# Patient Record
Sex: Female | Born: 1935 | Race: Black or African American | Hispanic: No | Smoking: Never smoker
Health system: Southern US, Community
[De-identification: ages and names within clinical notes are randomized; demographics above are authoritative.]

## PROBLEM LIST (undated history)

## (undated) DIAGNOSIS — M1990 Unspecified osteoarthritis, unspecified site: Secondary | ICD-10-CM

## (undated) HISTORY — DX: Unspecified osteoarthritis, unspecified site: M19.90

---

## 2006-12-17 ENCOUNTER — Emergency Department (HOSPITAL_COMMUNITY): Admission: EM | Admit: 2006-12-17 | Discharge: 2006-12-17 | Payer: Self-pay | Admitting: Emergency Medicine

## 2010-11-28 ENCOUNTER — Encounter: Payer: Self-pay | Admitting: Family Medicine

## 2015-04-05 ENCOUNTER — Emergency Department (HOSPITAL_COMMUNITY): Admission: EM | Admit: 2015-04-05 | Discharge: 2015-04-05 | Discharge status: Absent without leave | Payer: Self-pay

## 2015-04-05 ENCOUNTER — Ambulatory Visit (INDEPENDENT_AMBULATORY_CARE_PROVIDER_SITE_OTHER): Payer: Medicare Other | Admitting: Physician Assistant

## 2015-04-05 VITALS — BP 100/62 | HR 90 | Temp 97.9°F | Resp 17 | Ht <= 58 in | Wt 198.0 lb

## 2015-04-05 DIAGNOSIS — L723 Sebaceous cyst: Secondary | ICD-10-CM

## 2015-04-05 DIAGNOSIS — L089 Local infection of the skin and subcutaneous tissue, unspecified: Secondary | ICD-10-CM

## 2015-04-05 MED ORDER — DOXYCYCLINE HYCLATE 100 MG PO CAPS: 100.0000 mg | ORAL_CAPSULE | Freq: Two times a day (BID) | ORAL | Status: DC

## 2015-04-05 NOTE — Patient Instructions (Signed)
Please keep the dressing in place until tomorrow. You can change the dressing tomorrow but be sure to keep the packing in place. She can shower as normal after 24 hours.  Please take the antibiotic twice daily for 10 days.  Please come back to see us on Tuesday. Sooner if any signs of infection.

## 2015-04-05 NOTE — Progress Notes (Signed)
   Subjective:    Patient ID: Suzanne Case, female    DOB: 25-Jul-1927, 81 y.o.   MRN: [id number]  Chief Complaint  Patient presents with  . Mass    x1 week rt leg and lt arm     Medications, allergies, past medical history, surgical history, family history, social history and problem list reviewed and updated.  HPI  [age] yof new to practice presents with spot on left arm and right leg.  Recently stayed in different bed while visiting [place] couple weeks ago. Approx one week ago noticed lump on left forearm and smaller area on right lower leg. Her son is a IT sales professionalfirefighter and tried popping the mass on left arm while at home.   Today she presents as they are ongoing. Denies fevers, chills. Has not taken anything for them.   Review of Systems See HPI.     Objective:   Physical Exam  Constitutional: She is oriented to person, place, and time. She appears well-developed and well-nourished.  Non-toxic appearance. She does not have a sickly appearance. She does not appear ill. No distress.  BP 100/62 mmHg  Pulse 90  Temp(Src) 97.9 F (36.6 C) (Oral)  Resp 17  Ht 4\' 10"  (1.473 m)  Wt 198 lb (89.812 kg)  BMI 41.39 kg/m2  SpO2 92%   Neurological: She is alert and oriented to person, place, and time.  Skin:  Approx 1cm area erythema and induration left forearm, center of area is very fluctuant with pus expressing upon presentation.   Small 0.5 cm area erythema with crusted blood in center right mid anterior shin. Minimal induration. No fluctuance.       Assessment & Plan:   [age] yof new to practice presents with spot on left arm and right leg.  Infected sebaceous cyst - Plan: doxycycline (VIBRAMYCIN) 100 MG capsule --Attempted I&D of presumed abscess on left forearm, got both purulence and keratin material from likely infected sebaceous cyst, packing placed --cyst sac broken apart but removed as best as possible --doxy 10 days bid as has slight erythema on right shin and her son poked  into left forearm cyst at home approx one week ago --rtc 48 hrs for wound check  Donnajean Lopesodd M. Mystery Schrupp, PA-C Physician Assistant-Certified [facility]  04/05/2015 9:42 PM

## 2015-04-05 NOTE — Progress Notes (Signed)
Verbal Consent Obtained. Local anesthesia with 2 cc of 1% lidocaine with epi.  11 blade used to incise the lesion centrally.  Sebaceous material expressed along with minimal purulence expressed. Irrigated wound with 5 cc of saline and packed with 1/4 inch plain packing.  Cleansed and dressed.

## 2015-04-07 ENCOUNTER — Ambulatory Visit (INDEPENDENT_AMBULATORY_CARE_PROVIDER_SITE_OTHER): Payer: Medicare Other | Admitting: Physician Assistant

## 2015-04-07 VITALS — BP 120/70 | HR 83 | Temp 98.3°F | Resp 16 | Ht <= 58 in | Wt 194.0 lb

## 2015-04-07 DIAGNOSIS — L723 Sebaceous cyst: Secondary | ICD-10-CM

## 2015-04-07 DIAGNOSIS — L089 Local infection of the skin and subcutaneous tissue, unspecified: Secondary | ICD-10-CM

## 2015-04-07 NOTE — Progress Notes (Signed)
   Subjective:    Patient ID: Suzanne Case, female    DOB: [birth year], 81 y.o.   MRN: [id number]  HPI Patient presents for wound care status post I&D of abscess on right arm 2 days ago. Has been well in the interim. Has tolerated doxycycline well and has changed dressing regularly. Denies fever, drainage, or erythema. NKDA.   Review of Systems  Constitutional: Negative.  Negative for fever.  Skin: Positive for wound. Negative for color change.       Objective:   Physical Exam  Constitutional: She is oriented to person, place, and time. She appears well-developed and well-nourished. No distress.  Blood pressure 120/70, pulse 83, temperature 98.3 F (36.8 C), temperature source Oral, resp. rate 16, height 4\' 10"  (1.473 m), weight 194 lb (87.998 kg), SpO2 94 %.   HENT:  Head: Normocephalic and atraumatic.  Right Ear: External ear normal.  Left Ear: External ear normal.  Eyes: Conjunctivae are normal. Right eye exhibits no discharge. Left eye exhibits no discharge. No scleral icterus.  Pulmonary/Chest: Effort normal.  Neurological: She is alert and oriented to person, place, and time.  Skin: Skin is warm and dry. No rash noted. She is not diaphoretic. No erythema. No pallor.  Dressing and packing intact. Upon removal small amount of purulence presents. Necrotic along wound border and lateral aspect of wound.   Psychiatric: She has a normal mood and affect. Her behavior is normal. Judgment and thought content normal.   Procedure Consent obtained. Irrigated with 5 cc 1% lido. Wound explored and some necrotic tissue removed. 1/4 plain packing placed. Clean dressing placed.     Assessment & Plan:  1. Infected sebaceous cyst Continue antibiotic. Heating pad/warm compress on arm 3-4x daily. Patient did not tolerate necrotic tissue being removed well. Repacked and is to RTC 04/09/15 for wound care.    Janan Ridgeishira Kiowa Hollar PA-C  [facility] 04/07/2015 2:41 PM

## 2015-04-10 ENCOUNTER — Ambulatory Visit (INDEPENDENT_AMBULATORY_CARE_PROVIDER_SITE_OTHER): Payer: Medicare Other | Admitting: Physician Assistant

## 2015-04-10 VITALS — BP 136/82 | HR 84 | Temp 97.8°F | Resp 14 | Ht <= 58 in | Wt 194.0 lb

## 2015-04-10 DIAGNOSIS — Z4801 Encounter for change or removal of surgical wound dressing: Secondary | ICD-10-CM

## 2015-04-10 DIAGNOSIS — Z4889 Encounter for other specified surgical aftercare: Secondary | ICD-10-CM

## 2015-04-10 NOTE — Progress Notes (Signed)
[facility] [address], [place] [address] [phone]  Date:  04/10/2015   Name:  Suzanne LocksJessie Case   DOB:  [birth year]   MRN:  [id number]  PCP:  No primary care provider on file.    History of Present Illness:  Suzanne Case is an 81 y.o. female patient who [facility] up of an abscess s/p drainage.  This is day 5 following the incision and drainage from left forearm.  She reports that she is tolerating abx well and compliant on doxycycline.  She denies all increased redness, swelling, pain, fever, nausea, numbness, or tingling to the extremity.  She has been having the dressing changed by firefighter son, who reports that the wound is healing nicely.    There are no active problems to display for this patient.   Past Medical History  Diagnosis Date  . Arthritis     Past Surgical History  Procedure Laterality Date  . Cesarean section      History  Substance Use Topics  . Smoking status: Former Games developermoker  . Smokeless tobacco: Not on file  . Alcohol Use: No    Family History  Problem Relation Age of Onset  . Diabetes Mother   . Heart disease Mother   . Hypertension Mother   . Diabetes Father   . Heart disease Father   . Hypertension Father   . Diabetes Sister   . Heart disease Sister   . Hypertension Sister     No Known Allergies  Medication list has been reviewed and updated.  Current Outpatient Prescriptions on File Prior to Visit  Medication Sig Dispense Refill  . doxycycline (VIBRAMYCIN) 100 MG capsule Take 1 capsule (100 mg total) by mouth 2 (two) times daily. 20 capsule 0   No current facility-administered medications on file prior to visit.    ROS ROS otherwise unremarkable unless listed above.    Physical Examination: BP 136/82 mmHg  Pulse 84  Temp(Src) 97.8 F (36.6 C) (Oral)  Resp 14  Ht 4\' 10"  (1.473 m)  Wt 194 lb (87.998 kg)  BMI 40.56 kg/m2  SpO2 92% Ideal Body Weight: Weight in (lb) to have  BMI = 25: 119.4  Physical Exam  Constitutional: She is oriented to person, place, and time. She appears well-developed and well-nourished.  HENT:  Head: Normocephalic and atraumatic.  Eyes: Pupils are equal, round, and reactive to light.  Pulmonary/Chest: Effort normal. No respiratory distress.  Musculoskeletal: She exhibits no edema.  Neurological: She is alert and oriented to person, place, and time.  Skin: Skin is warm and dry.  Left forearm: Wound site with 1cm wide wound with purulent discharge along ejected packing and superficial layer of skin.  This was irrigated away and vigorously cleansed to reveal beefy red granulated tissue.  Depth of wound .5cm.  Tenderness is minimal with searching and cleansing the wound.  Surrounding skin without tenderness or swelling.    Psychiatric: She has a normal mood and affect. Her behavior is normal.     Assessment and Plan: 81 year old female is here today for followup of wound care on day 5 status post incision and drainage of a left forearm abscess.  This is healing well.  There is minimal depth at this time.  I have advised that she keep the wound clean with soap and water twice per day with a non-deodorant soap.  I have advised to keep bandaging on until tissue has formed within the  wound site.  She is to return for alarming symptoms.  Advised to continue doxycycline to completion.  Patient recommended and given card to expedite a return in 3 days.  Mother and daughter state that they may not be able to return that day or the day after.  I have advised returning for alarming symptoms.  This is cared for by firefighter son, who stays with the patient.  Advised daughter to relay recommendations to son of wound care, and distressing signs for rtc.   Encounter for postoperative wound care  Trena Platt, PA-C [facility] 04/10/2015 2:41 PM

## 2015-04-10 NOTE — Patient Instructions (Signed)
Keep the wound clean with soap and water.  Use a regular unscented soap, and insure that it is NOT a deodorant soap.  Suggestion is dial or an antibacterial soap.  Wash the wound twice per day, and cover with bandaging.  If there is more purulent drainage, swelling, redness, fever, or increased pain, you need to return immediately.  Please continue with the doxycycline to completion.   Incision and Drainage Care After Refer to this sheet in the next few weeks. These instructions provide you with information on caring for yourself after your procedure. Your caregiver may also give you more specific instructions. Your treatment has been planned according to current medical practices, but problems sometimes occur. Call your caregiver if you have any problems or questions after your procedure. HOME CARE INSTRUCTIONS   If antibiotic medicine is given, take it as directed. Finish it even if you start to feel better.  Only take over-the-counter or prescription medicines for pain, discomfort, or fever as directed by your caregiver.  Keep all follow-up appointments as directed by your caregiver.  Change any bandages (dressings) as directed by your caregiver. Replace old dressings with clean dressings.  Wash your hands before and after caring for your wound. You will receive specific instructions for cleansing and caring for your wound.  SEEK MEDICAL CARE IF:   You have increased pain, swelling, or redness around the wound.  You have increased drainage, smell, or bleeding from the wound.  You have muscle aches, chills, or you feel generally sick.  You have a fever. MAKE SURE YOU:   Understand these instructions.  Will watch your condition.  Will get help right away if you are not doing well or get worse. Document Released: 01/16/2012 Document Reviewed: 01/16/2012 [facility] Patient Information 2015 [employer] [place]. This information is not intended to replace advice given to you by your health  care provider. Make sure you discuss any questions you have with your health care provider.

## 2015-06-22 ENCOUNTER — Telehealth: Payer: Self-pay

## 2015-06-22 NOTE — Telephone Encounter (Signed)
Pt's daughter called requesting wound cx results. I do not see where we did a wound cx from visit in May. Pt's abscess has come back. They have an appt with her regular physician to have arm looked at.

## 2015-10-23 DIAGNOSIS — H40013 Open angle with borderline findings, low risk, bilateral: Secondary | ICD-10-CM | POA: Diagnosis not present

## 2015-12-08 DIAGNOSIS — Z23 Encounter for immunization: Secondary | ICD-10-CM | POA: Diagnosis not present

## 2015-12-08 DIAGNOSIS — E7800 Pure hypercholesterolemia, unspecified: Secondary | ICD-10-CM | POA: Diagnosis not present

## 2015-12-08 DIAGNOSIS — M2550 Pain in unspecified joint: Secondary | ICD-10-CM | POA: Diagnosis not present

## 2015-12-08 DIAGNOSIS — Z0000 Encounter for general adult medical examination without abnormal findings: Secondary | ICD-10-CM | POA: Diagnosis not present

## 2015-12-08 DIAGNOSIS — M25512 Pain in left shoulder: Secondary | ICD-10-CM | POA: Diagnosis not present

## 2015-12-08 DIAGNOSIS — R7309 Other abnormal glucose: Secondary | ICD-10-CM | POA: Diagnosis not present

## 2015-12-08 DIAGNOSIS — E559 Vitamin D deficiency, unspecified: Secondary | ICD-10-CM | POA: Diagnosis not present

## 2015-12-09 DIAGNOSIS — N390 Urinary tract infection, site not specified: Secondary | ICD-10-CM | POA: Diagnosis not present

## 2016-01-06 DIAGNOSIS — M1711 Unilateral primary osteoarthritis, right knee: Secondary | ICD-10-CM | POA: Diagnosis not present

## 2016-01-06 DIAGNOSIS — M1712 Unilateral primary osteoarthritis, left knee: Secondary | ICD-10-CM | POA: Diagnosis not present

## 2016-05-23 DIAGNOSIS — R509 Fever, unspecified: Secondary | ICD-10-CM | POA: Diagnosis not present

## 2016-05-24 ENCOUNTER — Observation Stay (HOSPITAL_COMMUNITY): Payer: Medicare Other

## 2016-05-24 ENCOUNTER — Observation Stay (HOSPITAL_COMMUNITY)
Admission: EM | Admit: 2016-05-24 | Discharge: 2016-05-27 | Disposition: A | Discharge status: Home / Self Care | Payer: Medicare Other | Attending: Internal Medicine | Admitting: Internal Medicine

## 2016-05-24 ENCOUNTER — Emergency Department (HOSPITAL_COMMUNITY)
Admission: EM | Admit: 2016-05-24 | Discharge: 2016-05-24 | Disposition: A | Discharge status: Home / Self Care | Payer: Self-pay | Attending: Emergency Medicine | Admitting: Emergency Medicine

## 2016-05-24 ENCOUNTER — Emergency Department (HOSPITAL_COMMUNITY): Payer: Medicare Other

## 2016-05-24 ENCOUNTER — Encounter (HOSPITAL_COMMUNITY): Payer: Self-pay | Admitting: Emergency Medicine

## 2016-05-24 DIAGNOSIS — F0390 Unspecified dementia without behavioral disturbance: Secondary | ICD-10-CM | POA: Insufficient documentation

## 2016-05-24 DIAGNOSIS — R0602 Shortness of breath: Secondary | ICD-10-CM | POA: Diagnosis not present

## 2016-05-24 DIAGNOSIS — G9340 Encephalopathy, unspecified: Secondary | ICD-10-CM | POA: Insufficient documentation

## 2016-05-24 DIAGNOSIS — Z5321 Procedure and treatment not carried out due to patient leaving prior to being seen by health care provider: Secondary | ICD-10-CM | POA: Insufficient documentation

## 2016-05-24 DIAGNOSIS — R05 Cough: Secondary | ICD-10-CM

## 2016-05-24 DIAGNOSIS — N390 Urinary tract infection, site not specified: Secondary | ICD-10-CM

## 2016-05-24 DIAGNOSIS — M1990 Unspecified osteoarthritis, unspecified site: Secondary | ICD-10-CM | POA: Insufficient documentation

## 2016-05-24 DIAGNOSIS — R5081 Fever presenting with conditions classified elsewhere: Secondary | ICD-10-CM | POA: Diagnosis not present

## 2016-05-24 DIAGNOSIS — R509 Fever, unspecified: Secondary | ICD-10-CM | POA: Insufficient documentation

## 2016-05-24 DIAGNOSIS — Z23 Encounter for immunization: Secondary | ICD-10-CM | POA: Insufficient documentation

## 2016-05-24 DIAGNOSIS — Z79899 Other long term (current) drug therapy: Secondary | ICD-10-CM | POA: Insufficient documentation

## 2016-05-24 DIAGNOSIS — R000 Tachycardia, unspecified: Secondary | ICD-10-CM | POA: Diagnosis not present

## 2016-05-24 DIAGNOSIS — Z87891 Personal history of nicotine dependence: Secondary | ICD-10-CM | POA: Diagnosis not present

## 2016-05-24 DIAGNOSIS — R059 Cough, unspecified: Secondary | ICD-10-CM

## 2016-05-24 DIAGNOSIS — R6510 Systemic inflammatory response syndrome (SIRS) of non-infectious origin without acute organ dysfunction: Secondary | ICD-10-CM | POA: Insufficient documentation

## 2016-05-24 LAB — CBC WITH DIFFERENTIAL/PLATELET
BASOS ABS: 0 10*3/uL (ref 0.0–0.1)
BASOS PCT: 0 %
Eosinophils Absolute: 0 10*3/uL (ref 0.0–0.7)
Eosinophils Relative: 0 %
HEMATOCRIT: 40.5 % (ref 36.0–46.0)
HEMOGLOBIN: 13 g/dL (ref 12.0–15.0)
Lymphocytes Relative: 5 %
Lymphs Abs: 0.6 10*3/uL — ABNORMAL LOW (ref 0.7–4.0)
MCH: 26.2 pg (ref 26.0–34.0)
MCHC: 32.1 g/dL (ref 30.0–36.0)
MCV: 81.5 fL (ref 78.0–100.0)
MONO ABS: 0.5 10*3/uL (ref 0.1–1.0)
Monocytes Relative: 4 %
NEUTROS ABS: 11 10*3/uL — AB (ref 1.7–7.7)
NEUTROS PCT: 91 %
Platelets: 230 10*3/uL (ref 150–400)
RBC: 4.97 MIL/uL (ref 3.87–5.11)
RDW: 14.6 % (ref 11.5–15.5)
WBC: 12.1 10*3/uL — ABNORMAL HIGH (ref 4.0–10.5)

## 2016-05-24 LAB — URINALYSIS, ROUTINE W REFLEX MICROSCOPIC
Bilirubin Urine: NEGATIVE
Glucose, UA: NEGATIVE mg/dL
Ketones, ur: 15 mg/dL — AB
NITRITE: POSITIVE — AB
Protein, ur: NEGATIVE mg/dL
SPECIFIC GRAVITY, URINE: 1.023 (ref 1.005–1.030)
pH: 5.5 (ref 5.0–8.0)

## 2016-05-24 LAB — COMPREHENSIVE METABOLIC PANEL
ALK PHOS: 70 U/L (ref 38–126)
ALT: 18 U/L (ref 14–54)
ANION GAP: 8 (ref 5–15)
AST: 25 U/L (ref 15–41)
Albumin: 3.9 g/dL (ref 3.5–5.0)
BILIRUBIN TOTAL: 0.9 mg/dL (ref 0.3–1.2)
BUN: 20 mg/dL (ref 6–20)
CALCIUM: 8.9 mg/dL (ref 8.9–10.3)
CO2: 27 mmol/L (ref 22–32)
Chloride: 101 mmol/L (ref 101–111)
Creatinine, Ser: 0.66 mg/dL (ref 0.44–1.00)
Glucose, Bld: 143 mg/dL — ABNORMAL HIGH (ref 65–99)
Potassium: 4.2 mmol/L (ref 3.5–5.1)
Sodium: 136 mmol/L (ref 135–145)
TOTAL PROTEIN: 7.8 g/dL (ref 6.5–8.1)

## 2016-05-24 LAB — I-STAT CG4 LACTIC ACID, ED: Lactic Acid, Venous: 1.22 mmol/L (ref 0.5–1.9)

## 2016-05-24 LAB — URINE MICROSCOPIC-ADD ON

## 2016-05-24 MED ORDER — SODIUM CHLORIDE 0.9 % IV SOLN
INTRAVENOUS | Status: DC
  Administered 2016-05-24 – 2016-05-25 (×2): via INTRAVENOUS

## 2016-05-24 MED ORDER — PNEUMOCOCCAL VAC POLYVALENT 25 MCG/0.5ML IJ INJ
0.5000 mL | INJECTION | INTRAMUSCULAR | Status: AC
  Administered 2016-05-25: 0.5 mL via INTRAMUSCULAR
  Filled 2016-05-24 (×2): qty 0.5

## 2016-05-24 MED ORDER — LEVOFLOXACIN 250 MG PO TABS
250.0000 mg | ORAL_TABLET | Freq: Every day | ORAL | Status: DC
Start: 2016-05-25 — End: 2016-05-25

## 2016-05-24 MED ORDER — SODIUM CHLORIDE 0.9 % IV BOLUS (SEPSIS)
1000.0000 mL | Freq: Once | INTRAVENOUS | Status: AC
  Administered 2016-05-24: 1000 mL via INTRAVENOUS

## 2016-05-24 MED ORDER — ACETAMINOPHEN 650 MG RE SUPP: 650.0000 mg | Freq: Four times a day (QID) | RECTAL | Status: DC | PRN

## 2016-05-24 MED ORDER — DEXTROSE 5 % IV SOLN: 1.0000 g | INTRAVENOUS | Status: DC

## 2016-05-24 MED ORDER — SODIUM CHLORIDE 0.9 % IV SOLN: INTRAVENOUS | Status: DC

## 2016-05-24 MED ORDER — ACETAMINOPHEN 325 MG PO TABS
650.0000 mg | ORAL_TABLET | Freq: Four times a day (QID) | ORAL | Status: DC | PRN
Start: 2016-05-24 — End: 2016-05-27
  Administered 2016-05-24 – 2016-05-26 (×2): 650 mg via ORAL
  Filled 2016-05-24 (×2): qty 2

## 2016-05-24 MED ORDER — TRAMADOL HCL 50 MG PO TABS
50.0000 mg | ORAL_TABLET | Freq: Two times a day (BID) | ORAL | Status: DC
  Administered 2016-05-24 – 2016-05-27 (×7): 50 mg via ORAL
  Filled 2016-05-24 (×7): qty 1

## 2016-05-24 MED ORDER — ONDANSETRON HCL 4 MG/2ML IJ SOLN: 4.0000 mg | Freq: Four times a day (QID) | INTRAMUSCULAR | Status: DC | PRN

## 2016-05-24 MED ORDER — ONDANSETRON HCL 4 MG PO TABS: 4.0000 mg | ORAL_TABLET | Freq: Four times a day (QID) | ORAL | Status: DC | PRN

## 2016-05-24 MED ORDER — DM-GUAIFENESIN ER 30-600 MG PO TB12
1.0000 | ORAL_TABLET | Freq: Two times a day (BID) | ORAL | Status: DC
  Administered 2016-05-24 – 2016-05-27 (×7): 1 via ORAL
  Filled 2016-05-24 (×7): qty 1

## 2016-05-24 MED ORDER — LEVOFLOXACIN 500 MG PO TABS
500.0000 mg | ORAL_TABLET | Freq: Once | ORAL | Status: AC
  Administered 2016-05-24: 500 mg via ORAL
  Filled 2016-05-24: qty 1

## 2016-05-24 MED ORDER — ENOXAPARIN SODIUM 40 MG/0.4ML ~~LOC~~ SOLN
40.0000 mg | SUBCUTANEOUS | Status: DC
  Administered 2016-05-24 – 2016-05-27 (×4): 40 mg via SUBCUTANEOUS
  Filled 2016-05-24 (×4): qty 0.4

## 2016-05-24 MED ORDER — CEFTRIAXONE SODIUM 1 G IJ SOLR
1.0000 g | Freq: Once | INTRAMUSCULAR | Status: AC
  Administered 2016-05-24: 1 g via INTRAVENOUS
  Filled 2016-05-24: qty 10

## 2016-05-24 MED ORDER — DEXTROSE 5 % IV SOLN: 2.0000 g | Freq: Once | INTRAVENOUS | Status: DC

## 2016-05-24 MED ORDER — ACETAMINOPHEN 500 MG PO TABS
1000.0000 mg | ORAL_TABLET | Freq: Once | ORAL | Status: AC
  Administered 2016-05-24: 1000 mg via ORAL
  Filled 2016-05-24: qty 2

## 2016-05-24 NOTE — ED Notes (Signed)
Pt given ginger ale.

## 2016-05-24 NOTE — ED Notes (Signed)
Upon insertion of in & out catheter, there were several pieces of saturated tissue paper noted within the labia.  Daughter made aware.

## 2016-05-24 NOTE — ED Notes (Signed)
Pt brought from home via EMS with family.  Family states patient has had a fever and chills since Saturday.  Also states she has mental alteration from baseline.

## 2016-05-24 NOTE — Progress Notes (Signed)
Pharmacy Antibiotic Note  Suzanne Case is a 82 y.o. female admitted on 05/24/2016 with UTI and bronchitis.  Pharmacy has been consulted for Levaquin dosing.  Plan:  Levaquin PO 500 mg x 1, then 250 mg q24 hr (for CrCl 20-49 ml/min)  Patient is stable at baseline SCr so no further dose adjustments anticipated.  Pharmacy will sign off at this time.   Height: 5' (152.4 cm) Weight: 185 lb 3 oz (84 kg) IBW/kg (Calculated) : 45.5  Temp (24hrs), Avg:99.7 F (37.6 C), Min:98.3 F (36.8 C), Max:102.3 F (39.1 C)   Recent Labs Lab 05/24/16 0047 05/24/16 0056  WBC 12.1*  --   CREATININE 0.66  --   LATICACIDVEN  --  1.22    Estimated Creatinine Clearance: 47.6 mL/min (by C-G formula based on Cr of 0.66).    No Known Allergies  Antimicrobials this admission: Rocephin x 1 Levaquin PO 7/18 >>   Dose adjustments this admission: ---  Microbiology results: 7/18 BCx: sent 7/18 UCx: sent  7/18 Sputum: ordered   Thank you for allowing pharmacy to be a part of this patient's care.  Bernadene Personrew Carmen Vallecillo, PharmD, BCPS Pager: [phone] 05/24/2016, 7:39 PM

## 2016-05-24 NOTE — ED Notes (Signed)
Pt comes from home via EMS.  Family states Saturday she began to have fever and chills. Family also states she is altered from baseline.  States they gave her aspirin an hour ago.

## 2016-05-24 NOTE — Progress Notes (Signed)
PHARMACY NOTE -  CEFTRIAXONE   Pharmacy has been consulted to assist with dosing of Ceftriaxone for UTI. Dosage remains stable at 1gm IV q24h and need for further dosage adjustment appears unlikely at present.    Will sign off at this time.  Please reconsult if a change in clinical status warrants re-evaluation of dosage.  Channing Savich, PharmD   

## 2016-05-24 NOTE — Care Management Obs Status (Signed)
MEDICARE OBSERVATION STATUS NOTIFICATION   Patient Details  Name: Coral SpikesJessie Mae Larocque MRN: [id number] Date of Birth: August 26, 1927   Medicare Observation Status Notification Given:  Yes    Alexis Goodelleele, Danel Studzinski K, RN 05/24/2016, 3:49 PM

## 2016-05-24 NOTE — ED Provider Notes (Signed)
CSN: [id number]     Arrival date & time 05/24/16  0026 History  By signing my name below, I, Vista Mink, attest that this documentation has been prepared under the direction and in the presence of Shon Baton, MD. Electronically signed, Vista Mink, ED Scribe. 05/24/2016. 2:27 AM.   Chief Complaint  Patient presents with  . Fever   The history is provided by the patient and a relative. No language interpreter was used.   HPI Comments: Megumi Treaster is a 82 y.o. female, Hx of UTI, chronic hip pain, brought in by ambulance, who presents to the Emergency Department complaining of a fever with possible altered mental status onset yesterday. Pt's son states that she has had a tactile fever with associated chills since yesterday. Pt also reports dry cough and sore throat a few days before and called EMS tonight because family states that she seems "a little off". Pt states they made an appt with her PCP for her sore throat and dry cough but became concerned when she developed a fever.  Denies urinary symptoms, nausea, vomiting, diarrhea, fatigue, chest pain. Pt denies having any sick contacts.  Past Medical History  Diagnosis Date  . Arthritis    Past Surgical History  Procedure Laterality Date  . Cesarean section     Family History  Problem Relation Age of Onset  . Diabetes Mother   . Heart disease Mother   . Hypertension Mother   . Diabetes Father   . Heart disease Father   . Hypertension Father   . Diabetes Sister   . Heart disease Sister   . Hypertension Sister    Social History  Substance Use Topics  . Smoking status: Former Games developer  . Smokeless tobacco: None  . Alcohol Use: No   OB History    No data available     Review of Systems  Constitutional: Positive for fever.  HENT: Positive for sore throat.   Respiratory: Positive for cough.   Cardiovascular: Negative for chest pain.  Gastrointestinal: Negative for nausea, vomiting and diarrhea.  Genitourinary:  Negative for dysuria.  All other systems reviewed and are negative.     Allergies  Review of patient's allergies indicates no known allergies.  Home Medications   Prior to Admission medications   Medication Sig Start Date End Date Taking? Authorizing Provider  B Complex-C (B-COMPLEX WITH VITAMIN C) tablet Take 1 tablet by mouth daily.   Yes Historical Provider, MD  cholecalciferol (VITAMIN D) 1000 units tablet Take 2,000 Units by mouth daily.   Yes Historical Provider, MD  CINNAMON PO Take 5 mLs by mouth as directed. Daily with coffee.   Yes Historical Provider, MD  Coenzyme Q10 (COQ10 PO) Take 1 capsule by mouth daily.   Yes Historical Provider, MD  Multiple Vitamin (MULTIVITAMIN WITH MINERALS) TABS tablet Take 1 tablet by mouth daily.   Yes Historical Provider, MD  Omega-3 Fatty Acids (FISH OIL) 1000 MG CAPS Take 1,000 mg by mouth daily.   Yes Historical Provider, MD  traMADol (ULTRAM) 50 MG tablet Take 50 mg by mouth 2 (two) times daily.   Yes Historical Provider, MD  TURMERIC PO Take 1 capsule by mouth daily.   Yes Historical Provider, MD  vitamin C (ASCORBIC ACID) 500 MG tablet Take 500 mg by mouth daily.   Yes Historical Provider, MD   BP 111/93 mmHg  Pulse 111  Temp(Src) 102.3 F (39.1 C) (Rectal)  Resp 15  SpO2 95% Physical Exam  Constitutional:  She is oriented to person, place, and time. She appears well-developed and well-nourished.  Overweight, no acute distress  HENT:  Head: Normocephalic and atraumatic.  Mouth/Throat: Oropharynx is clear and moist.  Eyes: Pupils are equal, round, and reactive to light.  Cardiovascular: Regular rhythm and normal heart sounds.   Tachycardia  Pulmonary/Chest: Effort normal and breath sounds normal. No respiratory distress. She has no wheezes.  Abdominal: Soft. Bowel sounds are normal. There is no tenderness. There is no rebound.  Neurological: She is alert and oriented to person, place, and time.  Fluent speech, cranial nerves II  through XII intact 5 out of 5 strength in all 4 extremities  Skin: Skin is warm and dry. No rash noted.  Psychiatric: She has a normal mood and affect.  Nursing note and vitals reviewed.   ED Course  Procedures  DIAGNOSTIC STUDIES: Oxygen Saturation is 95% on RA, adequate by my interpretation.  COORDINATION OF CARE: 2:17 AM-Will order medication and urinalysis. Discussed treatment plan with pt at bedside and pt agreed to plan.   Labs Review Labs Reviewed  COMPREHENSIVE METABOLIC PANEL - Abnormal; Notable for the following:    Glucose, Bld 143 (*)    All other components within normal limits  CBC WITH DIFFERENTIAL/PLATELET - Abnormal; Notable for the following:    WBC 12.1 (*)    Neutro Abs 11.0 (*)    Lymphs Abs 0.6 (*)    All other components within normal limits  URINALYSIS, ROUTINE W REFLEX MICROSCOPIC (NOT AT [facility]) - Abnormal; Notable for the following:    Color, Urine AMBER (*)    APPearance CLOUDY (*)    Hgb urine dipstick MODERATE (*)    Ketones, ur 15 (*)    Nitrite POSITIVE (*)    Leukocytes, UA LARGE (*)    All other components within normal limits  URINE MICROSCOPIC-ADD ON - Abnormal; Notable for the following:    Squamous Epithelial / LPF 0-5 (*)    Bacteria, UA MANY (*)    All other components within normal limits  CULTURE, BLOOD (ROUTINE X 2)  CULTURE, BLOOD (ROUTINE X 2)  URINE CULTURE  I-STAT CG4 LACTIC ACID, ED    Imaging Review Dg Chest Port 1 View  05/24/2016  CLINICAL DATA:  Fever. EXAM: PORTABLE CHEST 1 VIEW COMPARISON:  None. FINDINGS: The heart size and mediastinal contours are within normal limits. Both lungs are clear. The visualized skeletal structures are unremarkable. IMPRESSION: No active disease. Electronically Signed   By: Gerome Sam III M.D   On: 05/24/2016 02:04   I have personally reviewed and evaluated these images and lab results as part of my medical decision-making.   EKG Interpretation   Date/Time:  Tuesday May 24 2016  00:46:59 EDT Ventricular Rate:  110 PR Interval:    QRS Duration: 91 QT Interval:  334 QTC Calculation: 452 R Axis:   25 Text Interpretation:  Sinus tachycardia Low voltage, extremity and  precordial leads Confirmed by Lillyan Hitson  MD, Sivan Cuello ([id number]) on 05/24/2016  1:13:49 AM      MDM   Final diagnoses:  Other specified fever  UTI (lower urinary tract infection)    Patient presents with tactile fever and concerns for altered mental status from family. She is nontoxic. Temperature of 100 orally. 102.3 rectally. Pulse 111. Otherwise vital signs reassuring. History of UTI. Sepsis workup initiated. Lactate normal. Mild leukocytosis to 12.  Urine with too numerous to count white cells and many bacteria.  Chest x-ray clear. Patient given  IV Rocephin. Given fever and evidence of UTI, will admit for IV antibiotics.  I personally performed the services described in this documentation, which was scribed in my presence. The recorded information has been reviewed and is accurate.    Shon Batonourtney F Rayon Mcchristian, MD 05/24/16 [phone]

## 2016-05-24 NOTE — Progress Notes (Signed)
[facility] Spoke with patient and daughter at bedside, patient lives at home with family. Patient has mild dementia, per daughter she is somewhat more confused with being in the hospital, she is "fidgety". Patient answers questions appropriately but often defers to daughter to answer. Daughter indicates patient is independent of ADL's (baths self, dresses self, etc). Family provide meals, medication management, and transportation. Patient ambulates with a cane, has a walker but does not use. [facility]. Explained what is covered by insurance (skilled services) and what is out of pocket (private duty). Awaiting PT evaluations, family not interested in SNF. Will f/u with daughter after PT evals.

## 2016-05-24 NOTE — Progress Notes (Addendum)
[facility]                                                                                                                                                                         Patient Demographics  Suzanne Case, is a 82 y.o. female  WUJ:[id number]  NFA:[id number]  DOB - 03/16/1927  Admit date - 05/24/2016  Admitting Physician Alberteen Samhristopher P Danford, MD  Outpatient Primary MD for the patient is Gwynneth AlimentSANDERS,ROBYN N, MD  LOS -    Chief Complaint  Patient presents with  . Fever        Assessment & Plan    Patient seen She was admitted earlier this morning for fevers due to UTI + bronchitis, switch to empiric Levaquin , follow blood, Ur and sputum cultures, continue supportive care . Get a 2 view CXR.    Does have chronic osteoarthritis related bilateral hip and knee pain which can be followed by PCP.  Medications  Scheduled Meds: . [START ON 05/25/2016] cefTRIAXone (ROCEPHIN)  IV  1 g Intravenous Q24H  . enoxaparin (LOVENOX) injection  40 mg Subcutaneous Q24H  . [START ON 05/25/2016] pneumococcal 23 valent vaccine  0.5 mL Intramuscular Tomorrow-1000  . traMADol  50 mg Oral BID   Continuous Infusions:  PRN Meds:.acetaminophen **OR** acetaminophen, ondansetron **OR** ondansetron (ZOFRAN) IV    Time Spent in minutes   10 minutes   Susa RaringSINGH,PRASHANT K M.D on 05/24/2016 at 12:52 PM  Between 7am to 7pm - Pager - [phone]  After 7pm go to [email or web address] - password [email or web address]  And look for the night coverage person covering for me after hours  [facility] Office  [phone]    Subjective:   Suzanne SpikesJessie Mae Case today has, No headache, No chest pain, No abdominal pain - No Nausea, No new weakness tingling or numbness, Mild productive Cough - SOB. She does have chronic bilateral hip and knee pains from arthritis.  Objective:   Filed Vitals:   05/24/16 0200 05/24/16 0230 05/24/16  0319 05/24/16 0639  BP: 126/59 123/68 103/77 106/79  Pulse: 110 113 112 100  Temp:   98.8 F (37.1 C) 98.3 F (36.8 C)  TempSrc:   Oral Oral  Resp: 26 24 18 18   SpO2: 99% 96% 99% 99%    Wt Readings from Last 3 Encounters:  04/10/15 87.998 kg (194 lb)  04/07/15 87.998 kg (194 lb)  04/05/15 89.812 kg (198 lb)    No intake or output data in the 24 hours ending 05/24/16 1252  Exam Awake Alert, Oriented X 3, No new F.N deficits, Normal affect [place].AT,PERRAL Supple Neck,No JVD, No cervical lymphadenopathy appriciated.  Symmetrical Chest wall movement, Good air movement bilaterally, CTAB RRR,No Gallops,Rubs or new Murmurs, No  Parasternal Heave +ve B.Sounds, Abd Soft, Non tender, No organomegaly appriciated, No rebound - guarding or rigidity. No Cyanosis, Clubbing or edema, No new Rash or bruise    Data Review

## 2016-05-24 NOTE — H&P (Signed)
History and Physical  Patient Name: Suzanne Case     ZOX:[id number]    DOB: [birth year]    DOA: 05/24/2016 PCP: Gwynneth AlimentSANDERS,ROBYN N, MD   Patient coming from: Home  Chief Complaint: Fever  HPI: Suzanne Case is a 82 y.o. female with a past medical history significant for osteoarthritis who presents with fever and altered mental status.  The patient was in her usual state of health until this weekend when she developed sore throat and cough. The patient has mild dementia and lives with her daughter who is unimpressed with the symptoms until today when she noticed the patient seemed more confused and "out of it" and was warm to touch, so she brought her to the ER.    ED course: -Febrile to 102.54F, heart rate 111, blood pressure and oxygen saturation normal -Na 136, K 4.2, Cr 0.66 (BUN elevated), WBC 12.1 K, Hgb 13 -Lactate normal -Urinalysis showed pyuria and positive nitrites -Chest x-ray without focal opacity -Blood and urine cultures were obtained and she was given ceftriaxone for UTI and TRH were asked to evaluate for admission.  There is no purulent sputum, hemoptysis, dyspnea, or chest discomfort. There is no noticed dysuria, hematuria, change in urine odor, urinary frequency, urgency. She notes some hip pain (over RIGHT iliac crest) but no flank pain or suprapubic pain.  The patient is incontinent at baseline and wears Depends. Several times over the last few years, the patient has been told she has a "UTI" at her annual physical when a routine urinalysis was done.   ROS: All other systems negative except as just noted or noted in the history of present illness.    Past Medical History  Diagnosis Date  . Arthritis     Past Surgical History  Procedure Laterality Date  . Cesarean section      Social History: Patient lives with her daughter.  The patient walks unassisted.  She is independent with all ADLs but has mild dementia per daughter and is dependent for most  IADLs.  She is a former smoker.  From [place] originally, lived in [place] most of life.      No Known Allergies  Family history: Heart disease in mother.  Diabetes in multiple family members.  Prior to Admission medications   Medication Sig Start Date End Date Taking? Authorizing Provider  B Complex-C (B-COMPLEX WITH VITAMIN C) tablet Take 1 tablet by mouth daily.   Yes Historical Provider, MD  cholecalciferol (VITAMIN D) 1000 units tablet Take 2,000 Units by mouth daily.   Yes Historical Provider, MD  CINNAMON PO Take 5 mLs by mouth as directed. Daily with coffee.   Yes Historical Provider, MD  Coenzyme Q10 (COQ10 PO) Take 1 capsule by mouth daily.   Yes Historical Provider, MD  Multiple Vitamin (MULTIVITAMIN WITH MINERALS) TABS tablet Take 1 tablet by mouth daily.   Yes Historical Provider, MD  Omega-3 Fatty Acids (FISH OIL) 1000 MG CAPS Take 1,000 mg by mouth daily.   Yes Historical Provider, MD  traMADol (ULTRAM) 50 MG tablet Take 50 mg by mouth 2 (two) times daily.   Yes Historical Provider, MD  TURMERIC PO Take 1 capsule by mouth daily.   Yes Historical Provider, MD  vitamin C (ASCORBIC ACID) 500 MG tablet Take 500 mg by mouth daily.   Yes Historical Provider, MD       Physical Exam: BP 123/68 mmHg  Pulse 113  Temp(Src) 102.3 F (39.1 C) (Rectal)  Resp 24  SpO2  96% General appearance: Well-developed, overweight elderly female, alert and in no acute distress, mildly confused.   Eyes: Conjunctiva normal, lids and lashes normal.   PERRL.  ENT: No nasal deformity, discharge.  OP tacky dry without lesions.   Lymph: No cervical or supraclavicular lymphadenopathy. Skin: Warm and dry.  No suspicious rashes or lesions. Cardiac: Tachycardic, regular, nl S1-S2, no murmurs appreciated.  Capillary refill is brisk.  JVP normal.  No LE edema.  Radial and DP pulses 2+ and symmetric. Respiratory: Normal respiratory rate and rhythm.  CTAB without rales or wheezes. GI: Abdomen soft without rigidity.   No TTP. No ascites, distension, hepatosplenomegaly.   MSK: No deformities or effusions.  No clubbing/cyanosis. Neuro: Cranial nerves normal. Oriented to person, situation, confused to time. Otherwise sensorium intact and responding to questions, attention normal.  Speech is fluent.  Moves all extremities equally and with normal coordination.    Psych: Affect is pleasant but confused.  Judgment and insight appear normal.       Labs on Admission:  I have personally reviewed following labs and imaging studies: CBC:  Recent Labs Lab 05/24/16 0047  WBC 12.1*  NEUTROABS 11.0*  HGB 13.0  HCT 40.5  MCV 81.5  PLT 230   Basic Metabolic Panel:  Recent Labs Lab 05/24/16 0047  NA 136  K 4.2  CL 101  CO2 27  GLUCOSE 143*  BUN 20  CREATININE 0.66  CALCIUM 8.9   GFR: CrCl cannot be calculated (Unknown ideal weight.).  Liver Function Tests:  Recent Labs Lab 05/24/16 0047  AST 25  ALT 18  ALKPHOS 70  BILITOT 0.9  PROT 7.8  ALBUMIN 3.9   Sepsis Labs: Lactic acid 1.22 mmol/L         Radiological Exams on Admission: Personally reviewed: Dg Chest Port 1 View  05/24/2016  CLINICAL DATA:  Fever. EXAM: PORTABLE CHEST 1 VIEW COMPARISON:  None. FINDINGS: The heart size and mediastinal contours are within normal limits. Both lungs are clear. The visualized skeletal structures are unremarkable. IMPRESSION: No active disease. Electronically Signed   By: Gerome Sam III M.D   On: 05/24/2016 02:04    EKG: Independently reviewed. Rate 110, QTc 452, sinus tachycardia without ST changes.    Assessment/Plan 1. Fever, suspect febrile UTI:  Meets SIRS criteria with tachycardia, WBC, and fever.  Suspect urinary source given UA and dementia and incontinence with Depends at baseline.  URI symptoms sound viral and mild.  No evidence of end organ dysfunction, sepsis ruled out.   -Ceftriaxone 1g q24 hrs IV -Follow urine and blood cultures -IV fluids 1L now -Repeat BMP and CBC  tomorrow   2. Osteoarthritis:  -Tramadol as needed      DVT prophylaxis: Lovenox  Code Status: FULL  Family Communication: Daughter at bedside, CODE STATUS confirmed  Disposition Plan: Anticipate observation overnight.  Fluids for dehydration, IV antibiotics and follow urine culture.  Likely D/C either Tuesday evening or Wednesday morning. Consults called: None Admission status: OBS, med surg At the point of initial evaluation, it is my clinical opinion that admission for OBSERVATION is reasonable and necessary because the patient's presenting complaints in the context of their chronic conditions represent sufficient risk of deterioration or significant morbidity to constitute reasonable grounds for close observation in the hospital setting, but that the patient may be medically stable for discharge from the hospital within 24 to 48 hours.    Medical decision making: Patient seen at 3:02 AM on 05/24/2016.  The patient was  discussed with Dr. Wilkie Aye. What exists of the patient's chart was reviewed in depth.  Clinical condition: tachycardic but mentating well, will get fluids, stable.        Alberteen Sam [facility] Pager [phone]

## 2016-05-24 NOTE — ED Notes (Signed)
Bed: WA20 Expected date:  Expected time:  Means of arrival:  Comments: 82 yo F fever

## 2016-05-24 NOTE — ED Notes (Signed)
Report given to [place], RN on [address].

## 2016-05-25 DIAGNOSIS — R6510 Systemic inflammatory response syndrome (SIRS) of non-infectious origin without acute organ dysfunction: Secondary | ICD-10-CM

## 2016-05-25 DIAGNOSIS — N390 Urinary tract infection, site not specified: Secondary | ICD-10-CM

## 2016-05-25 DIAGNOSIS — J209 Acute bronchitis, unspecified: Secondary | ICD-10-CM | POA: Diagnosis not present

## 2016-05-25 DIAGNOSIS — M1711 Unilateral primary osteoarthritis, right knee: Secondary | ICD-10-CM | POA: Diagnosis not present

## 2016-05-25 LAB — CBC
HCT: 38.3 % (ref 36.0–46.0)
HEMOGLOBIN: 12 g/dL (ref 12.0–15.0)
MCH: 25.8 pg — ABNORMAL LOW (ref 26.0–34.0)
MCHC: 31.3 g/dL (ref 30.0–36.0)
MCV: 82.2 fL (ref 78.0–100.0)
Platelets: 221 10*3/uL (ref 150–400)
RBC: 4.66 MIL/uL (ref 3.87–5.11)
RDW: 14.9 % (ref 11.5–15.5)
WBC: 11.8 10*3/uL — AB (ref 4.0–10.5)

## 2016-05-25 LAB — EXPECTORATED SPUTUM ASSESSMENT W GRAM STAIN, RFLX TO RESP C

## 2016-05-25 LAB — BASIC METABOLIC PANEL
ANION GAP: 6 (ref 5–15)
BUN: 16 mg/dL (ref 6–20)
CALCIUM: 8.5 mg/dL — AB (ref 8.9–10.3)
CO2: 25 mmol/L (ref 22–32)
Chloride: 107 mmol/L (ref 101–111)
Creatinine, Ser: 0.49 mg/dL (ref 0.44–1.00)
GFR calc Af Amer: >60 mL/min (ref >60)
Glucose, Bld: 108 mg/dL — ABNORMAL HIGH (ref 65–99)
POTASSIUM: 3.9 mmol/L (ref 3.5–5.1)
SODIUM: 138 mmol/L (ref 135–145)

## 2016-05-25 LAB — EXPECTORATED SPUTUM ASSESSMENT W REFEX TO RESP CULTURE

## 2016-05-25 LAB — URINE CULTURE

## 2016-05-25 MED ORDER — DEXTROSE 5 % IV SOLN
500.0000 mg | INTRAVENOUS | Status: DC
  Administered 2016-05-25: 500 mg via INTRAVENOUS
  Filled 2016-05-25 (×2): qty 500

## 2016-05-25 MED ORDER — DEXTROSE 5 % IV SOLN
1.0000 g | INTRAVENOUS | Status: DC
  Administered 2016-05-25: 1 g via INTRAVENOUS
  Filled 2016-05-25 (×2): qty 10

## 2016-05-25 NOTE — Evaluation (Signed)
Physical Therapy Evaluation Patient Details Name: Suzanne Case Megna MRN: [id number] DOB: 13-Mar-1927 Today's Date: 05/25/2016   History of Present Illness  Suzanne Case [place] is a 82 y.o. female admitted on 05/24/2016 with UTI and bronchitis  Clinical Impression  The patient is pleasantly confused. Able to participate in therapy and ambulated x 140' with RW.  The daughter was present and agrees with HHPT safety eval as patient was  Modified independent in home. Pt admitted with above diagnosis. Pt currently with functional limitations due to the deficits listed below (see PT Problem List).  Pt will benefit from skilled PT to increase their independence and safety with mobility to allow discharge to the venue listed below.       Follow Up Recommendations Home health PT;Supervision/Assistance - 24 hour    Equipment Recommendations  None recommended by PT    Recommendations for Other Services       Precautions / Restrictions Precautions Precautions: Fall      Mobility  Bed Mobility Overal bed mobility: Modified Independent                Transfers Overall transfer level: Needs assistance Equipment used: Rolling walker (2 wheeled) Transfers: Sit to/from Stand Sit to Stand: Min guard            Ambulation/Gait Ambulation/Gait assistance: Min guard Ambulation Distance (Feet): 140 Feet Assistive device: Rolling walker (2 wheeled) Gait Pattern/deviations: Step-through pattern;Trunk flexed     General Gait Details: assist to turn around, gets away from the RW during a turn  Stairs            Wheelchair Mobility    Modified Rankin (Stroke Patients Only)       Balance Overall balance assessment: Needs assistance Sitting-balance support: Feet supported;No upper extremity supported Sitting balance-Leahy Scale: Good     Standing balance support: During functional activity;Bilateral upper extremity supported Standing balance-Leahy Scale: Fair                                Pertinent Vitals/Pain Pain Assessment: No/denies pain    Home Living Family/patient expects to be discharged to:: Private residence Living Arrangements: Children Available Help at Discharge: Family Type of Home: Apartment Home Access: Level entry     Home Layout: One level Home Equipment: Environmental consultantWalker - 4 wheels;Walker - 2 wheels;Cane - single point      Prior Function Level of Independence: Independent with assistive device(s);Needs assistance   Gait / Transfers Assistance Needed: uses cane or 4 wheeled Rw  ADL's / Homemaking Assistance Needed: assisted  by daughter        Hand Dominance        Extremity/Trunk Assessment   Upper Extremity Assessment: Overall WFL for tasks assessed           Lower Extremity Assessment: Overall WFL for tasks assessed      Cervical / Trunk Assessment: Other exceptions  Communication      Cognition Arousal/Alertness: Awake/alert Behavior During Therapy: WFL for tasks assessed/performed Overall Cognitive Status: History of cognitive impairments - at baseline                      General Comments      Exercises        Assessment/Plan    PT Assessment Patient needs continued PT services  PT Diagnosis Difficulty walking   PT Problem List Decreased strength;Decreased range of motion;Decreased activity tolerance;Decreased  mobility;Decreased balance;Decreased cognition;Decreased knowledge of use of DME  PT Treatment Interventions DME instruction;Gait training;Functional mobility training;Therapeutic activities;Therapeutic exercise;Patient/family education   PT Goals (Current goals can be found in the Care Plan section) Acute Rehab PT Goals Patient Stated Goal: per daughter, to return home PT Goal Formulation: With patient/family Time For Goal Achievement: 06/01/16 Potential to Achieve Goals: Good    Frequency Min 3X/week   Barriers to discharge        Co-evaluation                End of Session Equipment Utilized During Treatment: Gait belt Activity Tolerance: Patient tolerated treatment well Patient left: in chair;with call bell/phone within reach;with chair alarm set;with family/visitor present Nurse Communication: Mobility status    Functional Assessment Tool Used: clinical judgement Mobility: Walking and Moving Around Current Status (Z6109): At least 20 percent but less than 40 percent impaired, limited or restricted Mobility: Walking and Moving Around Goal Status [phone]): At least 1 percent but less than 20 percent impaired, limited or restricted    Time: 0955-1020 PT Time Calculation (min) (ACUTE ONLY): 25 min   Charges:   PT Evaluation $PT Eval Low Complexity: 1 Procedure PT Treatments $Gait Training: 8-22 mins   PT G Codes:   PT G-Codes **NOT FOR INPATIENT CLASS** Functional Assessment Tool Used: clinical judgement Mobility: Walking and Moving Around Current Status (U9811): At least 20 percent but less than 40 percent impaired, limited or restricted Mobility: Walking and Moving Around Goal Status [phone]): At least 1 percent but less than 20 percent impaired, limited or restricted    Rada Hay 05/25/2016, 10:29 AM  Blanchard Kelch PT [phone]

## 2016-05-25 NOTE — Progress Notes (Signed)
PROGRESS NOTE        PATIENT DETAILS Name: Suzanne Case Age: 82 y.o. Sex: female Date of Birth: 1927-01-02 Admit Date: 05/24/2016 Admitting Physician Alberteen Sam, MD ZOX:[id number],UJWJX N, MD  Brief Narrative: Patient is a 82 y.o. female with past medical history of dementia, osteoarthritis presented to the emergency room with fever, cough, dysuria and confusion. Patient was found to have systemic inflammatory response syndrome from either UTI or acute bronchitis. Admitted and started on empiric antibiotics.  Subjective: Awake, alert. Still febrile overnight.  Assessment/Plan: Principal Problem: Systemic inflammatory response syndrome secondary to UTI or acute bronchitis: Still febrile, but no longer tachycardic. Will continue IV Rocephin (should cover both lungs and urine), and use Zithromax for atypical coverage. Await culture data, although still febrile she is clinically improved and looks non-toxic-appearing.  Acute encephalopathy: Secondary to above, seems to have resolved as patient is awake and alert.  Osteoarthritis: Dominantly involving the knees-supportive care.  Deconditioning: PT evaluation completed, plans are for home health services on discharge.   DVT Prophylaxis: Prophylactic Lovenox   Code Status: Full code  Family Communication: Daughter at bedside  Disposition Plan: Remain inpatient-but will plan on Home health on discharge-suspect in the next day or so.  Antimicrobial agents: IV Rocephin 7/18>> Zithromax 7/19>> Levofloxacin 7/18>> 7/19  Procedures: None  CONSULTS:  None  Time spent: 25 minutes-Greater than 50% of this time was spent in counseling, explanation of diagnosis, planning of further management, and coordination of care.  MEDICATIONS: Anti-infectives    Start     Dose/Rate Route Frequency Ordered Stop   05/25/16 2200  levofloxacin (LEVAQUIN) tablet 250 mg  Status:  Discontinued     250 mg  Oral Daily at bedtime 05/24/16 1945 05/25/16 1056   05/25/16 2200  azithromycin (ZITHROMAX) 500 mg in dextrose 5 % 250 mL IVPB     500 mg 250 mL/hr over 60 Minutes Intravenous Every 24 hours 05/25/16 1056     05/25/16 1200  cefTRIAXone (ROCEPHIN) 1 g in dextrose 5 % 50 mL IVPB     1 g 100 mL/hr over 30 Minutes Intravenous Every 24 hours 05/25/16 1004     05/25/16 0200  cefTRIAXone (ROCEPHIN) 2 g in dextrose 5 % 50 mL IVPB  Status:  Discontinued     2 g 100 mL/hr over 30 Minutes Intravenous  Once 05/24/16 0318 05/24/16 0323   05/25/16 0200  cefTRIAXone (ROCEPHIN) 1 g in dextrose 5 % 50 mL IVPB  Status:  Discontinued     1 g 100 mL/hr over 30 Minutes Intravenous Every 24 hours 05/24/16 0324 05/24/16 1436   05/24/16 2200  levofloxacin (LEVAQUIN) tablet 500 mg     500 mg Oral  Once 05/24/16 1502 05/24/16 2129   05/24/16 0215  cefTRIAXone (ROCEPHIN) 1 g in dextrose 5 % 50 mL IVPB     1 g 100 mL/hr over 30 Minutes Intravenous  Once 05/24/16 0210 05/24/16 0245      Scheduled Meds: . azithromycin  500 mg Intravenous Q24H  . cefTRIAXone (ROCEPHIN)  IV  1 g Intravenous Q24H  . dextromethorphan-guaiFENesin  1 tablet Oral BID  . enoxaparin (LOVENOX) injection  40 mg Subcutaneous Q24H  . traMADol  50 mg Oral BID   Continuous Infusions:  PRN Meds:.acetaminophen **OR** [DISCONTINUED] acetaminophen, [DISCONTINUED] ondansetron **OR** ondansetron (ZOFRAN) IV   PHYSICAL EXAM: Vital signs:  Filed Vitals:   05/24/16 1854 05/24/16 2100 05/24/16 2235 05/25/16 0436  BP:  148/60  135/66  Pulse:  111  102  Temp:  103.1 F (39.5 C) 100.1 F (37.8 C) 100 F (37.8 C)  TempSrc:  Oral  Oral  Resp:  20  20  Height: 5' (1.524 m)     Weight: 84 kg (185 lb 3 oz)     SpO2:  98%  97%   Filed Weights   05/24/16 1854  Weight: 84 kg (185 lb 3 oz)   Body mass index is 36.17 kg/(m^2).   Gen Exam: Awake and alert with clear speech. Not in any distress Neck: Supple, No JVD.   Chest: B/L Clear.   CVS: S1  S2 Regular Abdomen: soft, BS +, non tender, non distended.  Extremities: no edema, lower extremities warm to touch. Neurologic: Non Focal.   Skin: No Rash or lesions   Wounds: N/A.   LABORATORY DATA: CBC:  Recent Labs Lab 05/24/16 0047 05/25/16 0523  WBC 12.1* 11.8*  NEUTROABS 11.0*  --   HGB 13.0 12.0  HCT 40.5 38.3  MCV 81.5 82.2  PLT 230 221    Basic Metabolic Panel:  Recent Labs Lab 05/24/16 0047 05/25/16 0523  NA 136 138  K 4.2 3.9  CL 101 107  CO2 27 25  GLUCOSE 143* 108*  BUN 20 16  CREATININE 0.66 0.49  CALCIUM 8.9 8.5*    GFR: Estimated Creatinine Clearance: 47.6 mL/min (by C-G formula based on Cr of 0.49).  Liver Function Tests:  Recent Labs Lab 05/24/16 0047  AST 25  ALT 18  ALKPHOS 70  BILITOT 0.9  PROT 7.8  ALBUMIN 3.9   No results for input(s): LIPASE, AMYLASE in the last 168 hours. No results for input(s): AMMONIA in the last 168 hours.  Coagulation Profile: No results for input(s): INR, PROTIME in the last 168 hours.  Cardiac Enzymes: No results for input(s): CKTOTAL, CKMB, CKMBINDEX, TROPONINI in the last 168 hours.  BNP (last 3 results) No results for input(s): PROBNP in the last 8760 hours.  HbA1C: No results for input(s): HGBA1C in the last 72 hours.  CBG: No results for input(s): GLUCAP in the last 168 hours.  Lipid Profile: No results for input(s): CHOL, HDL, LDLCALC, TRIG, CHOLHDL, LDLDIRECT in the last 72 hours.  Thyroid Function Tests: No results for input(s): TSH, T4TOTAL, FREET4, T3FREE, THYROIDAB in the last 72 hours.  Anemia Panel: No results for input(s): VITAMINB12, FOLATE, FERRITIN, TIBC, IRON, RETICCTPCT in the last 72 hours.  Urine analysis:    Component Value Date/Time   COLORURINE AMBER* 05/24/2016 0107   APPEARANCEUR CLOUDY* 05/24/2016 0107   LABSPEC 1.023 05/24/2016 0107   PHURINE 5.5 05/24/2016 0107   GLUCOSEU NEGATIVE 05/24/2016 0107   HGBUR MODERATE* 05/24/2016 0107   BILIRUBINUR NEGATIVE  05/24/2016 0107   KETONESUR 15* 05/24/2016 0107   PROTEINUR NEGATIVE 05/24/2016 0107   NITRITE POSITIVE* 05/24/2016 0107   LEUKOCYTESUR LARGE* 05/24/2016 0107    Sepsis Labs: Lactic Acid, Venous    Component Value Date/Time   LATICACIDVEN 1.22 05/24/2016 0056    MICROBIOLOGY: Recent Results (from the past 240 hour(s))  Urine culture     Status: Abnormal   Collection Time: 05/24/16  1:07 AM  Result Value Ref Range Status   Specimen Description URINE, CATHETERIZED  Final   Special Requests NONE  Final   Culture MULTIPLE SPECIES PRESENT, SUGGEST RECOLLECTION (A)  Final   Report Status 05/25/2016 FINAL  Final  Culture, expectorated sputum-assessment     Status: None   Collection Time: 05/25/16  8:20 AM  Result Value Ref Range Status   Specimen Description SPUTUM  Final   Special Requests NONE  Final   Sputum evaluation   Final    THIS SPECIMEN IS ACCEPTABLE. RESPIRATORY CULTURE REPORT TO FOLLOW.   Report Status 05/25/2016 FINAL  Final  Culture, respiratory (NON-Expectorated)     Status: None (Preliminary result)   Collection Time: 05/25/16  8:20 AM  Result Value Ref Range Status   Specimen Description SPUTUM  Final   Special Requests NONE  Final   Gram Stain   Final    ABUNDANT WBC PRESENT,BOTH PMN AND MONONUCLEAR ABUNDANT GRAM POSITIVE COCCI IN PAIRS IN CLUSTERS FEW GRAM VARIABLE ROD    Culture PENDING  Incomplete   Report Status PENDING  Incomplete    RADIOLOGY STUDIES/RESULTS: Dg Chest 2 View  05/24/2016  CLINICAL DATA:  Cough and congestion.  Shortness of breath. EXAM: CHEST  2 VIEW COMPARISON:  05/24/2016 FINDINGS: Sir low lung volumes thoracic spondylosis. Borderline enlargement of the cardiopericardial silhouette. Airway thickening is present, suggesting bronchitis or reactive airways disease. Degenerative glenohumeral arthropathy bilaterally. Atherosclerotic calcification of the aortic arch. No pleural effusion identified. IMPRESSION: 1. Borderline enlargement of  the cardiopericardial silhouette, without edema. 2. Airway thickening is present, suggesting bronchitis or reactive airways disease. 3. Atherosclerotic calcification of the aortic arch. 4. Degenerative findings in both shoulders. Electronically Signed   By: Gaylyn RongWalter  Liebkemann M.D.   On: 05/24/2016 16:17   Dg Chest Port 1 View  05/24/2016  CLINICAL DATA:  Fever. EXAM: PORTABLE CHEST 1 VIEW COMPARISON:  None. FINDINGS: The heart size and mediastinal contours are within normal limits. Both lungs are clear. The visualized skeletal structures are unremarkable. IMPRESSION: No active disease. Electronically Signed   By: Gerome Samavid  Williams III M.D   On: 05/24/2016 02:04       Jeoffrey MassedGHIMIRE,SHANKER, MD  [facility] Pager:[phone]  If 7PM-7AM, please contact night-coverage [email or web address] Password [email or web address] 05/25/2016, 12:19 PM

## 2016-05-26 DIAGNOSIS — R6510 Systemic inflammatory response syndrome (SIRS) of non-infectious origin without acute organ dysfunction: Secondary | ICD-10-CM | POA: Diagnosis not present

## 2016-05-26 DIAGNOSIS — J209 Acute bronchitis, unspecified: Secondary | ICD-10-CM

## 2016-05-26 DIAGNOSIS — N390 Urinary tract infection, site not specified: Secondary | ICD-10-CM | POA: Diagnosis not present

## 2016-05-26 MED ORDER — AZITHROMYCIN 250 MG PO TABS
500.0000 mg | ORAL_TABLET | Freq: Every day | ORAL | Status: DC
  Administered 2016-05-26 – 2016-05-27 (×2): 500 mg via ORAL
  Filled 2016-05-26 (×2): qty 2

## 2016-05-26 MED ORDER — LIDOCAINE HCL 1 % IJ SOLN
INTRAMUSCULAR | Status: AC
  Administered 2016-05-26: 20 mL
  Filled 2016-05-26: qty 20

## 2016-05-26 MED ORDER — CEFTRIAXONE SODIUM 1 G IJ SOLR
1.0000 g | INTRAMUSCULAR | Status: DC
  Administered 2016-05-26: 1 g via INTRAMUSCULAR
  Filled 2016-05-26 (×2): qty 10

## 2016-05-26 NOTE — Progress Notes (Signed)
IV infiltrated, refuses to be have another placed.  MD noitfied.

## 2016-05-26 NOTE — Progress Notes (Signed)
PROGRESS NOTE        PATIENT DETAILS Name: Suzanne Case Age: 82 y.o. Sex: female Date of Birth: 1927-07-28 Admit Date: 05/24/2016 Admitting Physician Alberteen Samhristopher P Danford, MD JYN:WGNFAOZ,HYQMVPCP:SANDERS,ROBYN N, MD  Brief Narrative: Patient is a 82 y.o. female with past medical history of dementia, osteoarthritis presented to the emergency room with fever, cough, dysuria and confusion. Patient was found to have systemic inflammatory response syndrome from either UTI or acute bronchitis. Admitted and started on empiric antibiotics.  Subjective: Awake, alert. Still with low-grade fever but fever curve much better. No other complaints.   Assessment/Plan: Principal Problem: Systemic inflammatory response syndrome secondary to UTI or acute bronchitis: Still febrile, but no longer tachycardic. Will continue IV Rocephin (should cover both lungs and urine), and use Zithromax for atypical coverage. Blood cultures negative, urine cultures shows polymicrobial growth-do not think this needs to be repeated as patient has been on antibiotics for the past number of days. She is clinically improved-although febrile fever is mostly low-grade now. I suspect she needs another 24 hours of inpatient monitoring prior to discharge home as she is very frail and elderly and at risk for recurrent hospitalizations with encephalopathy in case she has recurrent fevers.   Acute encephalopathy: Secondary to above, seems to have resolved as patient is awake and alert.  Osteoarthritis: Dominantly involving the knees-supportive care.  Deconditioning: PT evaluation completed, plans are for home health services on discharge.   DVT Prophylaxis: Prophylactic Lovenox   Code Status: Full code  Family Communication: Daughter at bedside  Disposition Plan: Remain inpatient-suspect discharge on 7/21 if afebrile.   Antimicrobial agents: IV Rocephin 7/18>> Zithromax 7/19>> Levofloxacin 7/18>>  7/19  Procedures: None  CONSULTS:  None  Time spent: 25 minutes-Greater than 50% of this time was spent in counseling, explanation of diagnosis, planning of further management, and coordination of care.  MEDICATIONS: Anti-infectives    Start     Dose/Rate Route Frequency Ordered Stop   05/25/16 2200  levofloxacin (LEVAQUIN) tablet 250 mg  Status:  Discontinued     250 mg Oral Daily at bedtime 05/24/16 1945 05/25/16 1056   05/25/16 2200  azithromycin (ZITHROMAX) 500 mg in dextrose 5 % 250 mL IVPB     500 mg 250 mL/hr over 60 Minutes Intravenous Every 24 hours 05/25/16 1056     05/25/16 1200  cefTRIAXone (ROCEPHIN) 1 g in dextrose 5 % 50 mL IVPB     1 g 100 mL/hr over 30 Minutes Intravenous Every 24 hours 05/25/16 1004     05/25/16 0200  cefTRIAXone (ROCEPHIN) 2 g in dextrose 5 % 50 mL IVPB  Status:  Discontinued     2 g 100 mL/hr over 30 Minutes Intravenous  Once 05/24/16 0318 05/24/16 0323   05/25/16 0200  cefTRIAXone (ROCEPHIN) 1 g in dextrose 5 % 50 mL IVPB  Status:  Discontinued     1 g 100 mL/hr over 30 Minutes Intravenous Every 24 hours 05/24/16 0324 05/24/16 1436   05/24/16 2200  levofloxacin (LEVAQUIN) tablet 500 mg     500 mg Oral  Once 05/24/16 1502 05/24/16 2129   05/24/16 0215  cefTRIAXone (ROCEPHIN) 1 g in dextrose 5 % 50 mL IVPB     1 g 100 mL/hr over 30 Minutes Intravenous  Once 05/24/16 0210 05/24/16 0245      Scheduled Meds: . azithromycin  500 mg Intravenous Q24H  . cefTRIAXone (ROCEPHIN)  IV  1 g Intravenous Q24H  . dextromethorphan-guaiFENesin  1 tablet Oral BID  . enoxaparin (LOVENOX) injection  40 mg Subcutaneous Q24H  . traMADol  50 mg Oral BID   Continuous Infusions:  PRN Meds:.acetaminophen **OR** [DISCONTINUED] acetaminophen, [DISCONTINUED] ondansetron **OR** ondansetron (ZOFRAN) IV   PHYSICAL EXAM: Vital signs: Filed Vitals:   05/25/16 0436 05/25/16 1432 05/25/16 2110 05/26/16 0458  BP: 135/66 119/83 146/73 136/86  Pulse: 102 108 107 56   Temp: 100 F (37.8 C) 100.8 F (38.2 C) 100.7 F (38.2 C) 98.4 F (36.9 C)  TempSrc: Oral Oral Oral Oral  Resp: 20 20 20 18   Height:      Weight:      SpO2: 97% 95% 98% 98%   Filed Weights   05/24/16 1854  Weight: 84 kg (185 lb 3 oz)   Body mass index is 36.17 kg/(m^2).   Gen Exam: Awake and alert with clear speech. Not in any distress Neck: Supple, No JVD.   Chest: B/L Clear.   CVS: S1 S2 Regular Abdomen: soft, BS +, non tender, non distended.  Extremities: no edema, lower extremities warm to touch. Neurologic: Non Focal.   Skin: No Rash or lesions   Wounds: N/A.   LABORATORY DATA: CBC:  Recent Labs Lab 05/24/16 0047 05/25/16 0523  WBC 12.1* 11.8*  NEUTROABS 11.0*  --   HGB 13.0 12.0  HCT 40.5 38.3  MCV 81.5 82.2  PLT 230 221    Basic Metabolic Panel:  Recent Labs Lab 05/24/16 0047 05/25/16 0523  NA 136 138  K 4.2 3.9  CL 101 107  CO2 27 25  GLUCOSE 143* 108*  BUN 20 16  CREATININE 0.66 0.49  CALCIUM 8.9 8.5*    GFR: Estimated Creatinine Clearance: 47.6 mL/min (by C-G formula based on Cr of 0.49).  Liver Function Tests:  Recent Labs Lab 05/24/16 0047  AST 25  ALT 18  ALKPHOS 70  BILITOT 0.9  PROT 7.8  ALBUMIN 3.9   No results for input(s): LIPASE, AMYLASE in the last 168 hours. No results for input(s): AMMONIA in the last 168 hours.  Coagulation Profile: No results for input(s): INR, PROTIME in the last 168 hours.  Cardiac Enzymes: No results for input(s): CKTOTAL, CKMB, CKMBINDEX, TROPONINI in the last 168 hours.  BNP (last 3 results) No results for input(s): PROBNP in the last 8760 hours.  HbA1C: No results for input(s): HGBA1C in the last 72 hours.  CBG: No results for input(s): GLUCAP in the last 168 hours.  Lipid Profile: No results for input(s): CHOL, HDL, LDLCALC, TRIG, CHOLHDL, LDLDIRECT in the last 72 hours.  Thyroid Function Tests: No results for input(s): TSH, T4TOTAL, FREET4, T3FREE, THYROIDAB in the last 72  hours.  Anemia Panel: No results for input(s): VITAMINB12, FOLATE, FERRITIN, TIBC, IRON, RETICCTPCT in the last 72 hours.  Urine analysis:    Component Value Date/Time   COLORURINE AMBER* 05/24/2016 0107   APPEARANCEUR CLOUDY* 05/24/2016 0107   LABSPEC 1.023 05/24/2016 0107   PHURINE 5.5 05/24/2016 0107   GLUCOSEU NEGATIVE 05/24/2016 0107   HGBUR MODERATE* 05/24/2016 0107   BILIRUBINUR NEGATIVE 05/24/2016 0107   KETONESUR 15* 05/24/2016 0107   PROTEINUR NEGATIVE 05/24/2016 0107   NITRITE POSITIVE* 05/24/2016 0107   LEUKOCYTESUR LARGE* 05/24/2016 0107    Sepsis Labs: Lactic Acid, Venous    Component Value Date/Time   LATICACIDVEN 1.22 05/24/2016 0056    MICROBIOLOGY: Recent Results (from the  past 240 hour(s))  Urine culture     Status: Abnormal   Collection Time: 05/24/16  1:07 AM  Result Value Ref Range Status   Specimen Description URINE, CATHETERIZED  Final   Special Requests NONE  Final   Culture MULTIPLE SPECIES PRESENT, SUGGEST RECOLLECTION (A)  Final   Report Status 05/25/2016 FINAL  Final  Blood Culture (routine x 2)     Status: None (Preliminary result)   Collection Time: 05/24/16  1:25 AM  Result Value Ref Range Status   Specimen Description BLOOD RIGHT AC  Final   Special Requests BOTTLES DRAWN AEROBIC AND ANAEROBIC 5CC  Final   Culture   Final    NO GROWTH 1 DAY Performed at [facility]    Report Status PENDING  Incomplete  Blood Culture (routine x 2)     Status: None (Preliminary result)   Collection Time: 05/24/16  1:25 AM  Result Value Ref Range Status   Specimen Description BLOOD LEFT AC  Final   Special Requests BOTTLES DRAWN AEROBIC AND ANAEROBIC 5CC  Final   Culture   Final    NO GROWTH 1 DAY Performed at [facility]    Report Status PENDING  Incomplete  Culture, expectorated sputum-assessment     Status: None   Collection Time: 05/25/16  8:20 AM  Result Value Ref Range Status   Specimen Description SPUTUM  Final   Special  Requests NONE  Final   Sputum evaluation   Final    THIS SPECIMEN IS ACCEPTABLE. RESPIRATORY CULTURE REPORT TO FOLLOW.   Report Status 05/25/2016 FINAL  Final  Culture, respiratory (NON-Expectorated)     Status: None (Preliminary result)   Collection Time: 05/25/16  8:20 AM  Result Value Ref Range Status   Specimen Description SPUTUM  Final   Special Requests NONE  Final   Gram Stain   Final    ABUNDANT WBC PRESENT,BOTH PMN AND MONONUCLEAR ABUNDANT GRAM POSITIVE COCCI IN PAIRS IN CLUSTERS FEW GRAM VARIABLE ROD    Culture   Final    CULTURE REINCUBATED FOR BETTER GROWTH Performed at [facility]    Report Status PENDING  Incomplete    RADIOLOGY STUDIES/RESULTS: Dg Chest 2 View  05/24/2016  CLINICAL DATA:  Cough and congestion.  Shortness of breath. EXAM: CHEST  2 VIEW COMPARISON:  05/24/2016 FINDINGS: Sir low lung volumes thoracic spondylosis. Borderline enlargement of the cardiopericardial silhouette. Airway thickening is present, suggesting bronchitis or reactive airways disease. Degenerative glenohumeral arthropathy bilaterally. Atherosclerotic calcification of the aortic arch. No pleural effusion identified. IMPRESSION: 1. Borderline enlargement of the cardiopericardial silhouette, without edema. 2. Airway thickening is present, suggesting bronchitis or reactive airways disease. 3. Atherosclerotic calcification of the aortic arch. 4. Degenerative findings in both shoulders. Electronically Signed   By: Gaylyn Rong M.D.   On: 05/24/2016 16:17   Dg Chest Port 1 View  05/24/2016  CLINICAL DATA:  Fever. EXAM: PORTABLE CHEST 1 VIEW COMPARISON:  None. FINDINGS: The heart size and mediastinal contours are within normal limits. Both lungs are clear. The visualized skeletal structures are unremarkable. IMPRESSION: No active disease. Electronically Signed   By: Gerome Sam III M.D   On: 05/24/2016 02:04       Jeoffrey Massed, MD  [facility] Pager:[phone]  If  7PM-7AM, please contact night-coverage [email or web address] Password [email or web address] 05/26/2016, 12:02 PM

## 2016-05-26 NOTE — Progress Notes (Signed)
PT Cancellation Note  Patient Details Name: Suzanne Case MRN: [id number] DOB: Feb 10, 1927   Cancelled Treatment:    Reason Eval/Treat Not Completed: Pain limiting ability to participate (patient declined, even with max encouragement siting that her knees hurt)   Sharen HeckHill, Minami Arriaga Elizabeth Darin Arndt PT [phone]  05/26/2016, 4:54 PM

## 2016-05-27 DIAGNOSIS — R6510 Systemic inflammatory response syndrome (SIRS) of non-infectious origin without acute organ dysfunction: Secondary | ICD-10-CM | POA: Diagnosis not present

## 2016-05-27 DIAGNOSIS — N390 Urinary tract infection, site not specified: Secondary | ICD-10-CM | POA: Diagnosis not present

## 2016-05-27 MED ORDER — DOXYCYCLINE HYCLATE 50 MG PO CAPS: 100.0000 mg | ORAL_CAPSULE | Freq: Two times a day (BID) | ORAL | Status: DC

## 2016-05-27 NOTE — Progress Notes (Signed)
Went over d/c instructions with patient and daughter.  Both verbalized understanding.  Left hospital via w/c with all belongings.

## 2016-05-27 NOTE — Progress Notes (Signed)
Spoke with Janell on the phone, [facility] Contacted [facility] for referral. Plan for d/c today.

## 2016-05-27 NOTE — Discharge Summary (Signed)
PATIENT DETAILS Name: Suzanne Case Age: 82 y.o. Sex: female Date of Birth: [birth year] MRN: [id number]. Admitting Physician: Alberteen Sam, MD WUJ:[id number],WGNFA N, MD  Admit Date: 05/24/2016 Discharge date: 05/27/2016  Recommendations for Outpatient Follow-up:  1. Please repeat CBC/BMET at next visit 2. Follow blood and sputum cultures until final-negative at the time of discharge.   PRIMARY DISCHARGE DIAGNOSIS:  Principal Problem:   Fever Active Problems:   UTI (lower urinary tract infection)   Tachycardia      PAST MEDICAL HISTORY: Past Medical History  Diagnosis Date  . Arthritis     DISCHARGE MEDICATIONS: Current Discharge Medication List    START taking these medications   Details  doxycycline (VIBRAMYCIN) 50 MG capsule Take 2 capsules (100 mg total) by mouth 2 (two) times daily. Qty: 4 capsule, Refills: 0      CONTINUE these medications which have NOT CHANGED   Details  B Complex-C (B-COMPLEX WITH VITAMIN C) tablet Take 1 tablet by mouth daily.    cholecalciferol (VITAMIN D) 1000 units tablet Take 2,000 Units by mouth daily.    CINNAMON PO Take 5 mLs by mouth as directed. Daily with coffee.    Coenzyme Q10 (COQ10 PO) Take 1 capsule by mouth daily.    Multiple Vitamin (MULTIVITAMIN WITH MINERALS) TABS tablet Take 1 tablet by mouth daily.    Omega-3 Fatty Acids (FISH OIL) 1000 MG CAPS Take 1,000 mg by mouth daily.    traMADol (ULTRAM) 50 MG tablet Take 50 mg by mouth 2 (two) times daily.    TURMERIC PO Take 1 capsule by mouth daily.    vitamin C (ASCORBIC ACID) 500 MG tablet Take 500 mg by mouth daily.        ALLERGIES:  No Known Allergies  BRIEF HPI:  See H&P, Labs, Consult and Test reports for all details in brief,Patient is a 82 y.o. female with past medical history of dementia, osteoarthritis presented to the emergency room with fever, cough, dysuria and confusion. Patient was found to have systemic inflammatory response  syndrome from either UTI or acute bronchitis. Admitted and started on empiric antibiotics.  CONSULTATIONS:   None  PERTINENT RADIOLOGIC STUDIES: Dg Chest 2 View  05/24/2016  CLINICAL DATA:  Cough and congestion.  Shortness of breath. EXAM: CHEST  2 VIEW COMPARISON:  05/24/2016 FINDINGS: Sir low lung volumes thoracic spondylosis. Borderline enlargement of the cardiopericardial silhouette. Airway thickening is present, suggesting bronchitis or reactive airways disease. Degenerative glenohumeral arthropathy bilaterally. Atherosclerotic calcification of the aortic arch. No pleural effusion identified. IMPRESSION: 1. Borderline enlargement of the cardiopericardial silhouette, without edema. 2. Airway thickening is present, suggesting bronchitis or reactive airways disease. 3. Atherosclerotic calcification of the aortic arch. 4. Degenerative findings in both shoulders. Electronically Signed   By: Gaylyn Rong M.D.   On: 05/24/2016 16:17   Dg Chest Port 1 View  05/24/2016  CLINICAL DATA:  Fever. EXAM: PORTABLE CHEST 1 VIEW COMPARISON:  None. FINDINGS: The heart size and mediastinal contours are within normal limits. Both lungs are clear. The visualized skeletal structures are unremarkable. IMPRESSION: No active disease. Electronically Signed   By: Gerome Sam III M.D   On: 05/24/2016 02:04     PERTINENT LAB RESULTS: CBC:  Recent Labs  05/25/16 0523  WBC 11.8*  HGB 12.0  HCT 38.3  PLT 221   CMET CMP     Component Value Date/Time   NA 138 05/25/2016 0523   K 3.9 05/25/2016 0523   CL 107 05/25/2016  0523   CO2 25 05/25/2016 0523   GLUCOSE 108* 05/25/2016 0523   BUN 16 05/25/2016 0523   CREATININE 0.49 05/25/2016 0523   CALCIUM 8.5* 05/25/2016 0523   PROT 7.8 05/24/2016 0047   ALBUMIN 3.9 05/24/2016 0047   AST 25 05/24/2016 0047   ALT 18 05/24/2016 0047   ALKPHOS 70 05/24/2016 0047   BILITOT 0.9 05/24/2016 0047   GFRNONAA >60 05/25/2016 0523   GFRAA >60 05/25/2016 0523     GFR Estimated Creatinine Clearance: 47.6 mL/min (by C-G formula based on Cr of 0.49). No results for input(s): LIPASE, AMYLASE in the last 72 hours. No results for input(s): CKTOTAL, CKMB, CKMBINDEX, TROPONINI in the last 72 hours. Invalid input(s): POCBNP No results for input(s): DDIMER in the last 72 hours. No results for input(s): HGBA1C in the last 72 hours. No results for input(s): CHOL, HDL, LDLCALC, TRIG, CHOLHDL, LDLDIRECT in the last 72 hours. No results for input(s): TSH, T4TOTAL, T3FREE, THYROIDAB in the last 72 hours.  Invalid input(s): FREET3 No results for input(s): VITAMINB12, FOLATE, FERRITIN, TIBC, IRON, RETICCTPCT in the last 72 hours. Coags: No results for input(s): INR in the last 72 hours.  Invalid input(s): PT Microbiology: Recent Results (from the past 240 hour(s))  Urine culture     Status: Abnormal   Collection Time: 05/24/16  1:07 AM  Result Value Ref Range Status   Specimen Description URINE, CATHETERIZED  Final   Special Requests NONE  Final   Culture MULTIPLE SPECIES PRESENT, SUGGEST RECOLLECTION (A)  Final   Report Status 05/25/2016 FINAL  Final  Blood Culture (routine x 2)     Status: None (Preliminary result)   Collection Time: 05/24/16  1:25 AM  Result Value Ref Range Status   Specimen Description BLOOD RIGHT AC  Final   Special Requests BOTTLES DRAWN AEROBIC AND ANAEROBIC 5CC  Final   Culture   Final    NO GROWTH 2 DAYS [facility]    Report Status PENDING  Incomplete  Blood Culture (routine x 2)     Status: None (Preliminary result)   Collection Time: 05/24/16  1:25 AM  Result Value Ref Range Status   Specimen Description BLOOD LEFT AC  Final   Special Requests BOTTLES DRAWN AEROBIC AND ANAEROBIC 5CC  Final   Culture   Final    NO GROWTH 2 DAYS [facility]formed at [facility]    Report Status PENDING  Incomplete  Culture, expectorated sputum-assessment     Status: None   Collection Time: 05/25/16  8:20 AM   Result Value Ref Range Status   Specimen Description SPUTUM  Final   Special Requests NONE  Final   Sputum evaluation   Final    THIS SPECIMEN IS ACCEPTABLE. RESPIRATORY CULTURE REPORT TO FOLLOW.   Report Status 05/25/2016 FINAL  Final  Culture, respiratory (NON-Expectorated)     Status: None (Preliminary result)   Collection Time: 05/25/16  8:20 AM  Result Value Ref Range Status   Specimen Description SPUTUM  Final   Special Requests NONE  Final   Gram Stain   Final    ABUNDANT WBC PRESENT,BOTH PMN AND MONONUCLEAR ABUNDANT GRAM POSITIVE COCCI IN PAIRS IN CLUSTERS FEW GRAM VARIABLE ROD    Culture   Final    CULTURE REINCUBATED FOR BETTER GROWTH Performed at [facility]    Report Status PENDING  Incomplete     BRIEF HOSPITAL COURSE:  Systemic inflammatory response syndrome secondary to UTI or acute  bronchitis: Much improved, afebrile for the past 24 hours. No longer tachycardic. Admitted and provided supportive care including empiric antibiotic therapy. Blood cultures are negative so far, sputum cultures are also negative so far but pending. Urine cultures positive for multiple bacterial morphotypes-likely a contaminated sample. Patient has clinically improved, I see no point in repeating urine cultures as the patient has been on IV antibiotics for the past number of days. She is improved without fever and wanted to go home today, I suspect she just needs another day of oral antibiotics following which she can stop taking antibiotics. Follow-up with PCP within a week. Above outlined plan was discussed with the patient's daughter at bedside she was agreeable.  Acute encephalopathy: Secondary to above, seems to have resolved as patient is awake and alert.  Osteoarthritis: Dominantly involving the knees-supportive care.  Deconditioning: PT evaluation completed, plans are for home health services on discharge.   TODAY-DAY OF DISCHARGE:  Subjective:   Suzanne Case  today has no headache,no chest abdominal pain,no new weakness tingling or numbness, feels much better wants to go home today.   Objective:   Blood pressure 134/66, pulse 90, temperature 98.6 F (37 C), temperature source Oral, resp. rate 18, height 5' (1.524 m), weight 84 kg (185 lb 3 oz), SpO2 100 %.  Intake/Output Summary (Last 24 hours) at 05/27/16 1133 Last data filed at 05/27/16 0900  Gross per 24 hour  Intake    120 ml  Output      0 ml  Net    120 ml   Filed Weights   05/24/16 1854  Weight: 84 kg (185 lb 3 oz)    Exam Awake Alert, Oriented *3, No new F.N deficits, Normal affect [place].AT,PERRAL Supple Neck,No JVD, No cervical lymphadenopathy appriciated.  Symmetrical Chest wall movement, Good air movement bilaterally, CTAB RRR,No Gallops,Rubs or new Murmurs, No Parasternal Heave +ve B.Sounds, Abd Soft, Non tender, No organomegaly appriciated, No rebound -guarding or rigidity. No Cyanosis, Clubbing or edema, No new Rash or bruise  DISCHARGE CONDITION: Stable  DISPOSITION: Home with home health services  DISCHARGE INSTRUCTIONS:    Activity:  As tolerated with Full fall precautions use walker/cane & assistance as needed  Get Medicines reviewed and adjusted: Please take all your medications with you for your next visit with your Primary MD  Please request your Primary MD to go over all hospital tests and procedure/radiological results at the follow up, please ask your Primary MD to get all Hospital records sent to his/her office.  If you experience worsening of your admission symptoms, develop shortness of breath, life threatening emergency, suicidal or homicidal thoughts you must seek medical attention immediately by calling 911 or calling your MD immediately  if symptoms less severe.  You must read complete instructions/literature along with all the possible adverse reactions/side effects for all the Medicines you take and that have been prescribed to you. Take any new  Medicines after you have completely understood and accpet all the possible adverse reactions/side effects.   Do not drive when taking Pain medications.   Do not take more than prescribed Pain, Sleep and Anxiety Medications  Special Instructions: If you have smoked or chewed Tobacco  in the last 2 yrs please stop smoking, stop any regular Alcohol  and or any Recreational drug use.  Wear Seat belts while driving.  Please note  You were cared for by a hospitalist during your hospital stay. Once you are discharged, your primary care physician will handle any  further medical issues. Please note that NO REFILLS for any discharge medications will be authorized once you are discharged, as it is imperative that you return to your primary care physician (or establish a relationship with a primary care physician if you do not have one) for your aftercare needs so that they can reassess your need for medications and monitor your lab values.   Diet recommendation: Regular Diet  Discharge Instructions    Call MD for:  persistant nausea and vomiting    Complete by:  As directed      Call MD for:  severe uncontrolled pain    Complete by:  As directed      Diet general    Complete by:  As directed      Increase activity slowly    Complete by:  As directed            Follow-up Information    Follow up with [facility].   Why:  physical therapy   Contact information:   [address] [place] [address] [phone]       Follow up with Gwynneth Aliment, MD. Schedule an appointment as soon as possible for a visit in 1 week.   Specialty:  Internal Medicine   Why:  Hospital follow up   Contact information:   [address] [place] [address] [phone]      Total Time spent on discharge equals 25  minutes.  SignedJeoffrey Massed 05/27/2016 11:33 AM

## 2016-05-28 LAB — CULTURE, RESPIRATORY W GRAM STAIN: Culture: NORMAL

## 2016-05-28 LAB — CULTURE, RESPIRATORY

## 2016-05-29 LAB — CULTURE, BLOOD (ROUTINE X 2)
Culture: NO GROWTH
Culture: NO GROWTH

## 2016-06-03 DIAGNOSIS — J069 Acute upper respiratory infection, unspecified: Secondary | ICD-10-CM | POA: Diagnosis not present

## 2016-06-03 DIAGNOSIS — Z09 Encounter for follow-up examination after completed treatment for conditions other than malignant neoplasm: Secondary | ICD-10-CM | POA: Diagnosis not present

## 2016-06-03 DIAGNOSIS — N390 Urinary tract infection, site not specified: Secondary | ICD-10-CM | POA: Diagnosis not present

## 2016-06-20 DIAGNOSIS — R799 Abnormal finding of blood chemistry, unspecified: Secondary | ICD-10-CM | POA: Diagnosis not present

## 2016-07-02 ENCOUNTER — Ambulatory Visit (INDEPENDENT_AMBULATORY_CARE_PROVIDER_SITE_OTHER): Payer: Medicare Other | Admitting: Family Medicine

## 2016-07-02 VITALS — BP 100/68 | HR 102 | Temp 99.7°F | Resp 20 | Wt 172.0 lb

## 2016-07-02 DIAGNOSIS — Z9189 Other specified personal risk factors, not elsewhere classified: Secondary | ICD-10-CM | POA: Diagnosis not present

## 2016-07-02 DIAGNOSIS — L03115 Cellulitis of right lower limb: Secondary | ICD-10-CM | POA: Diagnosis not present

## 2016-07-02 DIAGNOSIS — Z9181 History of falling: Secondary | ICD-10-CM

## 2016-07-02 DIAGNOSIS — R6889 Other general symptoms and signs: Secondary | ICD-10-CM | POA: Diagnosis not present

## 2016-07-02 DIAGNOSIS — Z789 Other specified health status: Secondary | ICD-10-CM | POA: Diagnosis not present

## 2016-07-02 LAB — POCT CBC
GRANULOCYTE PERCENT: 78.1 % (ref 37–80)
HEMATOCRIT: 37.1 % — AB (ref 37.7–47.9)
HEMOGLOBIN: 12.6 g/dL (ref 12.2–16.2)
Lymph, poc: 1.4 (ref 0.6–3.4)
MCH: 26.1 pg — AB (ref 27–31.2)
MCHC: 33.9 g/dL (ref 31.8–35.4)
MCV: 76.8 fL — AB (ref 80–97)
MID (cbc): 0.3 (ref 0–0.9)
MPV: 8.4 fL (ref 0–99.8)
POC GRANULOCYTE: 6 (ref 2–6.9)
POC LYMPH %: 17.7 % (ref 10–50)
POC MID %: 4.2 %M (ref 0–12)
Platelet Count, POC: 231 10*3/uL (ref 142–424)
RBC: 4.83 M/uL (ref 4.04–5.48)
RDW, POC: 15.4 %
WBC: 7.7 10*3/uL (ref 4.6–10.2)

## 2016-07-02 LAB — POCT SEDIMENTATION RATE: POCT SED RATE: 70 mm/hr — AB (ref 0–22)

## 2016-07-02 LAB — URIC ACID: Uric Acid, Serum: 4.4 mg/dL (ref 2.5–7.0)

## 2016-07-02 MED ORDER — CEPHALEXIN 500 MG PO CAPS: 500.0000 mg | ORAL_CAPSULE | Freq: Four times a day (QID) | ORAL | 0 refills | Status: DC

## 2016-07-02 NOTE — Patient Instructions (Addendum)
     IF you received an x-ray today, you will receive an invoice from [facility]. Please contact [facility] at [phone] with questions or concerns regarding your invoice.   IF you received labwork today, you will receive an invoice from [facility]. Please contact [facility] at [phone] with questions or concerns regarding your invoice.   Our billing staff will not be able to assist you with questions regarding bills from these companies.  You will be contacted with the lab results as soon as they are available. The fastest way to get your results is to activate your My Chart account. Instructions are located on the last page of this paperwork. If you have not heard from us regarding the results in 2 weeks, please contact this office.      Cellulitis Cellulitis is an infection of the skin and the tissue beneath it. The infected area is usually red and tender. Cellulitis occurs most often in the arms and lower legs.  CAUSES  Cellulitis is caused by bacteria that enter the skin through cracks or cuts in the skin. The most common types of bacteria that cause cellulitis are staphylococci and streptococci. SIGNS AND SYMPTOMS   Redness and warmth.  Swelling.  Tenderness or pain.  Fever. DIAGNOSIS  Your health care provider can usually determine what is wrong based on a physical exam. Blood tests may also be done. TREATMENT  Treatment usually involves taking an antibiotic medicine. HOME CARE INSTRUCTIONS   Take your antibiotic medicine as directed by your health care provider. Finish the antibiotic even if you start to feel better.  Keep the infected arm or leg elevated to reduce swelling.  Apply a warm cloth to the affected area up to 4 times per day to relieve pain.  Take medicines only as directed by your health care provider.  Keep all follow-up visits as directed by your health care provider. SEEK MEDICAL CARE IF:   You  notice red streaks coming from the infected area.  Your red area gets larger or turns dark in color.  Your bone or joint underneath the infected area becomes painful after the skin has healed.  Your infection returns in the same area or another area.  You notice a swollen bump in the infected area.  You develop new symptoms.  You have a fever. SEEK IMMEDIATE MEDICAL CARE IF:   You feel very sleepy.  You develop vomiting or diarrhea.  You have a general ill feeling (malaise) with muscle aches and pains.   This information is not intended to replace advice given to you by your health care provider. Make sure you discuss any questions you have with your health care provider.   Document Released: 08/03/2005 Document Revised: 07/15/2015 Document Reviewed: 01/09/2012 Elsevier Interactive Patient Education 2016 Elsevier Inc.  

## 2016-07-02 NOTE — Progress Notes (Signed)
By signing my name below I, Tereasa Coop, attest that this documentation has been prepared under the direction and in the presence of Delman Cheadle, MD. Electonically Signed. Tereasa Coop, Scribe 07/02/2016 at 2:27 PM  Subjective:    Patient ID: Suzanne Case, female    DOB: Jul 09, 1927, 82 y.o.   MRN: [id number]  Chief Complaint  Patient presents with  . Foot Swelling    x3days    HPI Suzanne Case is a 82 y.o. female who presents to the [facility] complaining of worsening rt foot swelling for the past 3 days. Rt foot is also painful. Pain started in her ankle, then started radiating into her feet.  Pt and pt's family deny history of gout. Pt had a sebaceous cyst a year ago. Pt denies eating red meat, shellfish, or drinking beer.  Pt denies any diaphoresis, CP, SOB, abd pain, N/V/D.   Pt was admitted to the hospital from ED a month ago due to a UTI and bronchitis. Pt was started on doxycycline. Pt followed up with her PCP a week ago.   Pt takes tramadol for arthritis pain.   Pt had her feet done 10 days ago.   Pt denies any DM.    Patient Active Problem List   Diagnosis Date Noted  . UTI (lower urinary tract infection) 05/24/2016  . Fever 05/24/2016  . Tachycardia 05/24/2016    Current Outpatient Prescriptions on File Prior to Visit  Medication Sig Dispense Refill  . B Complex-C (B-COMPLEX WITH VITAMIN C) tablet Take 1 tablet by mouth daily.    . cholecalciferol (VITAMIN D) 1000 units tablet Take 2,000 Units by mouth daily.    Marland Kitchen CINNAMON PO Take 5 mLs by mouth as directed. Daily with coffee.    . Coenzyme Q10 (COQ10 PO) Take 1 capsule by mouth daily.    . Multiple Vitamin (MULTIVITAMIN WITH MINERALS) TABS tablet Take 1 tablet by mouth daily.    . Omega-3 Fatty Acids (FISH OIL) 1000 MG CAPS Take 1,000 mg by mouth daily.    . traMADol (ULTRAM) 50 MG tablet Take 50 mg by mouth 2 (two) times daily.    . TURMERIC PO Take 1 capsule by mouth daily.     . vitamin C (ASCORBIC ACID) 500 MG tablet Take 500 mg by mouth daily.     No current facility-administered medications on file prior to visit.     No Known Allergies  Depression screen [facility] 2/9 07/02/2016 04/10/2015 04/05/2015  Decreased Interest 0 0 0  Down, Depressed, Hopeless 0 0 0  PHQ - 2 Score 0 0 0       Review of Systems  Constitutional: Negative for diaphoresis and fever.  HENT: Negative for congestion and sore throat.   Eyes: Negative for visual disturbance.  Respiratory: Negative for shortness of breath.   Cardiovascular: Negative for chest pain.  Gastrointestinal: Negative for diarrhea, nausea and vomiting.  Genitourinary: Negative for dysuria and flank pain.  Musculoskeletal: Positive for joint swelling (rt ankle/foot pain and swelling).  Skin: Negative for rash.  Neurological: Negative for headaches.  Psychiatric/Behavioral: The patient is not nervous/anxious.        Objective:   Physical Exam  Constitutional: She is oriented to person, place, and time. She appears well-developed and well-nourished. No distress.  HENT:  Head: Normocephalic and atraumatic.  Eyes: Conjunctivae are normal. Pupils are equal, round, and reactive to light.  Neck: Neck supple.  Cardiovascular: Normal rate.   Pulses:  Dorsalis pedis pulses are 2+ on the right side, and 2+ on the left side.       Posterior tibial pulses are 2+ on the right side, and 2+ on the left side.  Pulmonary/Chest: Effort normal.  Musculoskeletal: Normal range of motion.  Pt had moderate non-pitting effusion dorsum rt foot with warmth tenderness and erythema, mostly over lateral aspect does not extend past malleolus. Pt also has mild tinea pedis and severe onychomycosis.  Neurological: She is alert and oriented to person, place, and time.  Skin: Skin is warm and dry.  Pt had moderate non-pitting effusion dorsum rt foot with warmth tenderness and erythema, mostly over lateral aspect does not extend past  malleolus. Pt also has mild tinea pedis and severe onychomycosis  Psychiatric: She has a normal mood and affect. Her behavior is normal.  Nursing note and vitals reviewed.  EGFR is greater than 60.  BP 100/68   Pulse (!) 102   Temp 99.7 F (37.6 C) (Oral)   Resp 20   Wt 172 lb (78 kg)   SpO2 97%   BMI 33.59 kg/m       Assessment & Plan:  Pt was encouraged to elevate and heat rt foot and ankle. Pt and pt's family acknowledged and agreed to plan. Recheck in 2-3d. If dramatic improvement, ok to f/u w/ PCP  1. Cellulitis of right lower extremity   2. Difficulty transferring   3. At low risk for fall   4. At high risk for injury related to fall   Needs bedside commode due to fall risk - rx given  Orders Placed This Encounter  Procedures  . DME Bedside commode  . Uric acid  . POCT CBC  . POCT SEDIMENTATION RATE    Meds ordered this encounter  Medications  . cephALEXin (KEFLEX) 500 MG capsule    Sig: Take 1 capsule (500 mg total) by mouth 4 (four) times daily.    Dispense:  28 capsule    Refill:  0    I personally performed the services described in this documentation, which was scribed in my presence. The recorded information has been reviewed and considered, and addended by me as needed.   Delman Cheadle, M.D.  [facility] [address] [place],  [address] [phone] phone [phone] fax  07/02/16 5:51 PM

## 2016-07-04 ENCOUNTER — Telehealth: Payer: Self-pay

## 2016-07-04 DIAGNOSIS — Z9181 History of falling: Secondary | ICD-10-CM

## 2016-07-04 NOTE — Telephone Encounter (Signed)
If ok Rx pended. I will also fax everything if you just sign. [employer]

## 2016-07-04 NOTE — Telephone Encounter (Signed)
Msg is for Suzanne Case, Pt will need a home use only bed side commode [id number] rxn with medical [facility]. This will need to state why she needs this. The FAX [phone]  Please advise  [phone]

## 2016-07-05 NOTE — Telephone Encounter (Signed)
Patient's daughter is calling because they still need the prescription faxed from us along with the ov notes. Please call once completed!  [phone]

## 2016-07-05 NOTE — Telephone Encounter (Signed)
Gave pt hard copy of signed rx at time of visit

## 2016-07-06 NOTE — Telephone Encounter (Signed)
Daughter Charlton AmorJanell called back and I clarified that Rx for bedside commode and OV notes [facility] [phone]. Sent along w/demo and ins info. Received confirmation.

## 2016-07-07 DIAGNOSIS — R262 Difficulty in walking, not elsewhere classified: Secondary | ICD-10-CM | POA: Diagnosis not present

## 2016-07-15 DIAGNOSIS — H40013 Open angle with borderline findings, low risk, bilateral: Secondary | ICD-10-CM | POA: Diagnosis not present

## 2016-07-15 DIAGNOSIS — H2513 Age-related nuclear cataract, bilateral: Secondary | ICD-10-CM | POA: Diagnosis not present

## 2016-07-15 DIAGNOSIS — H40053 Ocular hypertension, bilateral: Secondary | ICD-10-CM | POA: Diagnosis not present

## 2016-07-18 ENCOUNTER — Telehealth: Payer: Self-pay

## 2016-07-18 NOTE — Telephone Encounter (Signed)
Yes - def rec recheck in OV tomorrow - overnight elevate as much as poss and ice.

## 2016-07-18 NOTE — Telephone Encounter (Signed)
Patient foot is swelling. She didn't finish her antibiotic because she misplaced the prescription. Patient want to know if she need more or what are her options. [phone].

## 2016-07-19 NOTE — Telephone Encounter (Signed)
Advised daughter, they want to know if she can get another Rx for an antibiotic because the pt misplaced. She cannot find the bottle.

## 2016-07-19 NOTE — Telephone Encounter (Signed)
No this was 1 wk of an antibiotic that was given about 3 weeks ago - she might need a different one or something else entirely - is to risk to do over the phone - esp considering pt's age and medical problems. She really needs to be seen for this.

## 2016-07-20 NOTE — Telephone Encounter (Signed)
Pt's daughter advised.  

## 2017-03-07 DIAGNOSIS — H40012 Open angle with borderline findings, low risk, left eye: Secondary | ICD-10-CM | POA: Diagnosis not present

## 2017-03-07 DIAGNOSIS — H40021 Open angle with borderline findings, high risk, right eye: Secondary | ICD-10-CM | POA: Diagnosis not present

## 2017-04-26 DIAGNOSIS — R413 Other amnesia: Secondary | ICD-10-CM | POA: Diagnosis not present

## 2017-04-26 DIAGNOSIS — E7800 Pure hypercholesterolemia, unspecified: Secondary | ICD-10-CM | POA: Diagnosis not present

## 2017-04-26 DIAGNOSIS — Z0000 Encounter for general adult medical examination without abnormal findings: Secondary | ICD-10-CM | POA: Diagnosis not present

## 2017-04-26 DIAGNOSIS — D649 Anemia, unspecified: Secondary | ICD-10-CM | POA: Diagnosis not present

## 2017-04-26 DIAGNOSIS — E559 Vitamin D deficiency, unspecified: Secondary | ICD-10-CM | POA: Diagnosis not present

## 2017-04-27 DIAGNOSIS — Z0000 Encounter for general adult medical examination without abnormal findings: Secondary | ICD-10-CM | POA: Diagnosis not present

## 2017-04-27 DIAGNOSIS — R413 Other amnesia: Secondary | ICD-10-CM | POA: Diagnosis not present

## 2017-04-27 DIAGNOSIS — M2550 Pain in unspecified joint: Secondary | ICD-10-CM | POA: Diagnosis not present

## 2017-08-10 DIAGNOSIS — H40023 Open angle with borderline findings, high risk, bilateral: Secondary | ICD-10-CM | POA: Diagnosis not present

## 2017-11-13 DIAGNOSIS — Z7689 Persons encountering health services in other specified circumstances: Secondary | ICD-10-CM | POA: Diagnosis not present

## 2017-11-13 DIAGNOSIS — R7309 Other abnormal glucose: Secondary | ICD-10-CM | POA: Diagnosis not present

## 2017-11-13 DIAGNOSIS — M130 Polyarthritis, unspecified: Secondary | ICD-10-CM | POA: Diagnosis not present

## 2017-11-13 DIAGNOSIS — N390 Urinary tract infection, site not specified: Secondary | ICD-10-CM | POA: Diagnosis not present

## 2017-11-13 DIAGNOSIS — Z741 Need for assistance with personal care: Secondary | ICD-10-CM | POA: Diagnosis not present

## 2017-12-03 DIAGNOSIS — M159 Polyosteoarthritis, unspecified: Secondary | ICD-10-CM | POA: Diagnosis not present

## 2017-12-03 DIAGNOSIS — M6281 Muscle weakness (generalized): Secondary | ICD-10-CM | POA: Diagnosis not present

## 2017-12-03 DIAGNOSIS — E785 Hyperlipidemia, unspecified: Secondary | ICD-10-CM | POA: Diagnosis not present

## 2017-12-03 DIAGNOSIS — E559 Vitamin D deficiency, unspecified: Secondary | ICD-10-CM | POA: Diagnosis not present

## 2017-12-03 DIAGNOSIS — Z7982 Long term (current) use of aspirin: Secondary | ICD-10-CM | POA: Diagnosis not present

## 2017-12-03 DIAGNOSIS — Z9181 History of falling: Secondary | ICD-10-CM | POA: Diagnosis not present

## 2017-12-06 DIAGNOSIS — Z7982 Long term (current) use of aspirin: Secondary | ICD-10-CM | POA: Diagnosis not present

## 2017-12-06 DIAGNOSIS — E785 Hyperlipidemia, unspecified: Secondary | ICD-10-CM | POA: Diagnosis not present

## 2017-12-06 DIAGNOSIS — E559 Vitamin D deficiency, unspecified: Secondary | ICD-10-CM | POA: Diagnosis not present

## 2017-12-06 DIAGNOSIS — M6281 Muscle weakness (generalized): Secondary | ICD-10-CM | POA: Diagnosis not present

## 2017-12-06 DIAGNOSIS — Z9181 History of falling: Secondary | ICD-10-CM | POA: Diagnosis not present

## 2017-12-06 DIAGNOSIS — M159 Polyosteoarthritis, unspecified: Secondary | ICD-10-CM | POA: Diagnosis not present

## 2017-12-08 DIAGNOSIS — M159 Polyosteoarthritis, unspecified: Secondary | ICD-10-CM | POA: Diagnosis not present

## 2017-12-08 DIAGNOSIS — Z9181 History of falling: Secondary | ICD-10-CM | POA: Diagnosis not present

## 2017-12-08 DIAGNOSIS — E785 Hyperlipidemia, unspecified: Secondary | ICD-10-CM | POA: Diagnosis not present

## 2017-12-08 DIAGNOSIS — Z7982 Long term (current) use of aspirin: Secondary | ICD-10-CM | POA: Diagnosis not present

## 2017-12-08 DIAGNOSIS — E559 Vitamin D deficiency, unspecified: Secondary | ICD-10-CM | POA: Diagnosis not present

## 2017-12-08 DIAGNOSIS — M6281 Muscle weakness (generalized): Secondary | ICD-10-CM | POA: Diagnosis not present

## 2017-12-11 DIAGNOSIS — Z7982 Long term (current) use of aspirin: Secondary | ICD-10-CM | POA: Diagnosis not present

## 2017-12-11 DIAGNOSIS — E559 Vitamin D deficiency, unspecified: Secondary | ICD-10-CM | POA: Diagnosis not present

## 2017-12-11 DIAGNOSIS — E785 Hyperlipidemia, unspecified: Secondary | ICD-10-CM | POA: Diagnosis not present

## 2017-12-11 DIAGNOSIS — M6281 Muscle weakness (generalized): Secondary | ICD-10-CM | POA: Diagnosis not present

## 2017-12-11 DIAGNOSIS — M159 Polyosteoarthritis, unspecified: Secondary | ICD-10-CM | POA: Diagnosis not present

## 2017-12-11 DIAGNOSIS — Z9181 History of falling: Secondary | ICD-10-CM | POA: Diagnosis not present

## 2017-12-19 DIAGNOSIS — Z9181 History of falling: Secondary | ICD-10-CM | POA: Diagnosis not present

## 2017-12-19 DIAGNOSIS — Z7982 Long term (current) use of aspirin: Secondary | ICD-10-CM | POA: Diagnosis not present

## 2017-12-19 DIAGNOSIS — E559 Vitamin D deficiency, unspecified: Secondary | ICD-10-CM | POA: Diagnosis not present

## 2017-12-19 DIAGNOSIS — E785 Hyperlipidemia, unspecified: Secondary | ICD-10-CM | POA: Diagnosis not present

## 2017-12-19 DIAGNOSIS — M159 Polyosteoarthritis, unspecified: Secondary | ICD-10-CM | POA: Diagnosis not present

## 2017-12-19 DIAGNOSIS — M6281 Muscle weakness (generalized): Secondary | ICD-10-CM | POA: Diagnosis not present

## 2017-12-21 DIAGNOSIS — Z7982 Long term (current) use of aspirin: Secondary | ICD-10-CM | POA: Diagnosis not present

## 2017-12-21 DIAGNOSIS — E559 Vitamin D deficiency, unspecified: Secondary | ICD-10-CM | POA: Diagnosis not present

## 2017-12-21 DIAGNOSIS — M159 Polyosteoarthritis, unspecified: Secondary | ICD-10-CM | POA: Diagnosis not present

## 2017-12-21 DIAGNOSIS — E785 Hyperlipidemia, unspecified: Secondary | ICD-10-CM | POA: Diagnosis not present

## 2017-12-21 DIAGNOSIS — Z9181 History of falling: Secondary | ICD-10-CM | POA: Diagnosis not present

## 2017-12-21 DIAGNOSIS — M6281 Muscle weakness (generalized): Secondary | ICD-10-CM | POA: Diagnosis not present

## 2018-03-06 ENCOUNTER — Encounter (HOSPITAL_COMMUNITY): Payer: Self-pay | Admitting: Emergency Medicine

## 2018-03-06 ENCOUNTER — Emergency Department (HOSPITAL_COMMUNITY): Payer: Medicare Other

## 2018-03-06 ENCOUNTER — Emergency Department (HOSPITAL_COMMUNITY)
Admission: EM | Admit: 2018-03-06 | Discharge: 2018-03-06 | Disposition: A | Discharge status: Home / Self Care | Payer: Medicare Other | Attending: Emergency Medicine | Admitting: Emergency Medicine

## 2018-03-06 DIAGNOSIS — G8929 Other chronic pain: Secondary | ICD-10-CM

## 2018-03-06 DIAGNOSIS — M25562 Pain in left knee: Secondary | ICD-10-CM | POA: Diagnosis not present

## 2018-03-06 DIAGNOSIS — R262 Difficulty in walking, not elsewhere classified: Secondary | ICD-10-CM | POA: Diagnosis not present

## 2018-03-06 DIAGNOSIS — R531 Weakness: Secondary | ICD-10-CM | POA: Diagnosis present

## 2018-03-06 DIAGNOSIS — Z87891 Personal history of nicotine dependence: Secondary | ICD-10-CM | POA: Diagnosis not present

## 2018-03-06 DIAGNOSIS — R2689 Other abnormalities of gait and mobility: Secondary | ICD-10-CM | POA: Diagnosis not present

## 2018-03-06 DIAGNOSIS — S80212A Abrasion, left knee, initial encounter: Secondary | ICD-10-CM | POA: Diagnosis not present

## 2018-03-06 DIAGNOSIS — Z79899 Other long term (current) drug therapy: Secondary | ICD-10-CM | POA: Insufficient documentation

## 2018-03-06 DIAGNOSIS — Z7982 Long term (current) use of aspirin: Secondary | ICD-10-CM | POA: Insufficient documentation

## 2018-03-06 DIAGNOSIS — R296 Repeated falls: Secondary | ICD-10-CM

## 2018-03-06 DIAGNOSIS — M25561 Pain in right knee: Secondary | ICD-10-CM | POA: Insufficient documentation

## 2018-03-06 DIAGNOSIS — R42 Dizziness and giddiness: Secondary | ICD-10-CM | POA: Diagnosis not present

## 2018-03-06 DIAGNOSIS — M170 Bilateral primary osteoarthritis of knee: Secondary | ICD-10-CM | POA: Diagnosis not present

## 2018-03-06 DIAGNOSIS — R030 Elevated blood-pressure reading, without diagnosis of hypertension: Secondary | ICD-10-CM | POA: Diagnosis not present

## 2018-03-06 LAB — URINALYSIS, ROUTINE W REFLEX MICROSCOPIC
BILIRUBIN URINE: NEGATIVE
Glucose, UA: NEGATIVE mg/dL
HGB URINE DIPSTICK: NEGATIVE
KETONES UR: NEGATIVE mg/dL
Nitrite: NEGATIVE
PROTEIN: NEGATIVE mg/dL
Specific Gravity, Urine: 1.004 — ABNORMAL LOW (ref 1.005–1.030)
pH: 7 (ref 5.0–8.0)

## 2018-03-06 LAB — CBC
HCT: 40.9 % (ref 36.0–46.0)
Hemoglobin: 12.6 g/dL (ref 12.0–15.0)
MCH: 25.5 pg — ABNORMAL LOW (ref 26.0–34.0)
MCHC: 30.8 g/dL (ref 30.0–36.0)
MCV: 82.8 fL (ref 78.0–100.0)
PLATELETS: 248 10*3/uL (ref 150–400)
RBC: 4.94 MIL/uL (ref 3.87–5.11)
RDW: 14.8 % (ref 11.5–15.5)
WBC: 5 10*3/uL (ref 4.0–10.5)

## 2018-03-06 LAB — BASIC METABOLIC PANEL
Anion gap: 9 (ref 5–15)
BUN: 19 mg/dL (ref 6–20)
CALCIUM: 9.2 mg/dL (ref 8.9–10.3)
CO2: 26 mmol/L (ref 22–32)
Chloride: 105 mmol/L (ref 101–111)
Creatinine, Ser: 0.47 mg/dL (ref 0.44–1.00)
GFR calc Af Amer: >60 mL/min (ref >60)
Glucose, Bld: 112 mg/dL — ABNORMAL HIGH (ref 65–99)
Potassium: 4.1 mmol/L (ref 3.5–5.1)
SODIUM: 140 mmol/L (ref 135–145)

## 2018-03-06 LAB — CBG MONITORING, ED: Glucose-Capillary: 99 mg/dL (ref 65–99)

## 2018-03-06 NOTE — ED Notes (Signed)
ED Provider at bedside. 

## 2018-03-06 NOTE — Care Management Note (Signed)
Case Management Note  Patient Details  Name: Suzanne Case MRN: [id number] Date of Birth: 07-Nov-1927  CM contacted for [facility] services and PCS.  CM spoke with pt's daughter who advised pt was previous with [facility] and would like to use them again.  Discussed DME and found a need for a wheelchair.  Updated Dr. Donnald Garre.  Contacted Clydie Braun with [facility] who accepted pt for services and will bring a wheelchair down to pt prior to D/C.  No further CM needs noted at this time.  Expected Discharge Date:   03/06/2018               Expected Discharge Plan:  Home w Home Health Services  Discharge planning Services  CM Consult  Post Acute Care Choice:  Durable Medical Equipment, Home Health Choice offered to:  Adult Children  DME Arranged:  Government social research officer DME Agency:  [facility]  HH Arranged:  RN, PT, OT, Nurse's Aide, Speech Therapy, Social Work [employer] Agency:  [facility]  Status of Service:  Completed, signed off  Elianah Karis, Lynnae Sandhoff, RN 03/06/2018, 3:17 PM

## 2018-03-06 NOTE — ED Provider Notes (Signed)
[facility]-EMERGENCY DEPT Provider Note   CSN: [id number] Arrival date & time: 03/06/18  1103     History   Chief Complaint Chief Complaint  Patient presents with  . Dizziness  . Knee Pain    HPI Suzanne Case is a 84 y.o. female.  HPI Patient has been having increasing falls at home.  Patient's daughter reports that she fell last night.  The patient has had problems with pain in her knees and generalized weakness.  She usually uses a walker for transfers and assistance in activities of daily living.  Daughter reports lately she is becoming weak and her knees start to buckle and she slowly fall to the ground.  This happened last night.  She reports today she was quite weak in her ability to transfer as well.  She reports she did seem to scrape her left knee on the wheelchair previously.  She however suffers from severe chronic pain due to arthritis in the knees.  She has not had fever, cough, chest pain.  No vomiting no diarrhea no abdominal pain.  Daughter reports the symptoms have been gradual.  She reports she was doing better when physical therapy was coming regularly to the home to assist her.  She reports when physical therapy stops coming than her mother becomes less active and more weak and unstable. Past Medical History:  Diagnosis Date  . Arthritis     Patient Active Problem List   Diagnosis Date Noted  . UTI (lower urinary tract infection) 05/24/2016  . Fever 05/24/2016  . Tachycardia 05/24/2016    Past Surgical History:  Procedure Laterality Date  . CESAREAN SECTION       OB History   None      Home Medications    Prior to Admission medications   Medication Sig Start Date End Date Taking? Authorizing Provider  aspirin EC 81 MG tablet Take 81 mg by mouth daily.   Yes [provider]  B Complex-C (B-COMPLEX WITH VITAMIN C) tablet Take 1 tablet by mouth daily.   Yes [provider]  Cholecalciferol (VITAMIN D  PO) Take by mouth.   Yes [provider]  cholecalciferol (VITAMIN D) 1000 units tablet Take 2,000 Units by mouth daily.   Yes [provider]  CINNAMON PO Take 5 mLs by mouth as directed. Daily with coffee.   Yes [provider]  Coenzyme Q10 (COQ10 PO) Take 1 capsule by mouth daily.   Yes [provider]  CRANBERRY PO Take by mouth.   Yes [provider]  ketotifen (ZADITOR) 0.025 % ophthalmic solution Place 1 drop into both eyes daily as needed (allergies).   Yes [provider]  Multiple Vitamin (MULTIVITAMIN WITH MINERALS) TABS tablet Take 1 tablet by mouth daily.   Yes [provider]  naproxen sodium (ALEVE) 220 MG tablet Take 220 mg by mouth daily as needed (pain).   Yes [provider]  TURMERIC PO Take 1 capsule by mouth daily.   Yes [provider]  vitamin C (ASCORBIC ACID) 500 MG tablet Take 500 mg by mouth daily.   Yes [provider]  VITAMIN E PO Take 1 tablet by mouth daily.   Yes [provider]  cephALEXin (KEFLEX) 500 MG capsule Take 1 capsule (500 mg total) by mouth 4 (four) times daily. Patient not taking: Reported on 03/06/2018 07/02/16   Sherren Mocha, MD    Family History Family History  Problem Relation Age of  Onset  . Diabetes Mother   . Heart disease Mother   . Hypertension Mother   . Diabetes Father   . Heart disease Father   . Hypertension Father   . Diabetes Sister   . Heart disease Sister   . Hypertension Sister     Social History Social History   Tobacco Use  . Smoking status: Former Smoker  Substance Use Topics  . Alcohol use: No    Alcohol/week: 0.0 oz  . Drug use: No     Allergies   Patient has no known allergies.   Review of Systems Review of Systems 10 Systems reviewed and are negative for acute change except as noted in the HPI.   Physical Exam Updated Vital Signs BP 132/70 (BP Location: Right Arm)   Pulse 82 Comment: Simultaneous  filing. User may not have seen previous data.  Temp 98.9 F (37.2 C)   Resp 14   SpO2 99% Comment: Simultaneous filing. User may not have seen previous data.  Physical Exam  Constitutional:  Patient is alert and interactive.  Cheerful and nontoxic without respiratory distress.  Central obesity.  HENT:  Head: Normocephalic and atraumatic.  Mouth/Throat: Oropharynx is clear and moist.  Eyes: EOM are normal.  Neck: Neck supple.  Cardiovascular: Normal rate, regular rhythm, normal heart sounds and intact distal pulses.  Pulmonary/Chest: Effort normal and breath sounds normal.  Abdominal: Soft. She exhibits no distension. There is no tenderness. There is no guarding.  Musculoskeletal:  Skin condition is good.  No significant peripheral edema of the lower extremities.  Very superficial abrasion to the left knee.  No associated erythema.  Patient has arthritic enlargement of both knees.  No effusions.  Neurological: She is alert. She exhibits normal muscle tone. Coordination normal.  Skin: Skin is warm and dry.  Psychiatric: She has a normal mood and affect.     ED Treatments / Results  Labs (all labs ordered are listed, but only abnormal results are displayed) Labs Reviewed  BASIC METABOLIC PANEL - Abnormal; Notable for the following components:      Result Value   Glucose, Bld 112 (*)    All other components within normal limits  CBC - Abnormal; Notable for the following components:   MCH 25.5 (*)    All other components within normal limits  URINALYSIS, ROUTINE W REFLEX MICROSCOPIC - Abnormal; Notable for the following components:   Color, Urine STRAW (*)    Specific Gravity, Urine 1.004 (*)    Leukocytes, UA TRACE (*)    Bacteria, UA RARE (*)    All other components within normal limits  CBG MONITORING, ED    EKG None  Radiology Dg Knee Complete 4 Views Left  Result Date: 03/06/2018 CLINICAL DATA:  Chronic bilateral knee pain.  No trauma. EXAM: RIGHT KNEE - COMPLETE 4+  VIEW; LEFT KNEE - COMPLETE 4+ VIEW COMPARISON:  None. FINDINGS: Left knee: No acute fracture or dislocation. Severe medial and moderate lateral and patellofemoral compartment joint space narrowing with subchondral sclerosis and osteophyte formation. Small suprapatellar joint effusion. Chondrocalcinosis of the lateral meniscus. Osteopenia. Vascular calcifications. Right knee: No acute fracture or dislocation. Severe medial and patellofemoral and moderate lateral compartment joint space narrowing with subchondral sclerosis and osteophyte formation. Trace suprapatellar joint effusion. Chondrocalcinosis of the lateral meniscus. Osteopenia. Vascular calcifications. IMPRESSION: 1.  No acute osseous abnormality. 2. 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 Electronically Signed   By: Obie Dredge M.D.   On: 03/06/2018 15:17  Dg Knee Complete 4 Views Right  Result Date: 03/06/2018 CLINICAL DATA:  Chronic bilateral knee pain.  No trauma. EXAM: RIGHT KNEE - COMPLETE 4+ VIEW; LEFT KNEE - COMPLETE 4+ VIEW COMPARISON:  None. FINDINGS: Left knee: No acute fracture or dislocation. Severe medial and moderate lateral and patellofemoral compartment joint space narrowing with subchondral sclerosis and osteophyte formation. Small suprapatellar joint effusion. Chondrocalcinosis of the lateral meniscus. Osteopenia. Vascular calcifications. Right knee: No acute fracture or dislocation. Severe medial and patellofemoral and moderate lateral compartment joint space narrowing with subchondral sclerosis and osteophyte formation. Trace suprapatellar joint effusion. Chondrocalcinosis of the lateral meniscus. Osteopenia. Vascular calcifications. IMPRESSION: 1.  No acute osseous abnormality. 2. 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 Electronically Signed   By: Obie Dredge M.D.   On: 03/06/2018 15:17    Procedures Procedures (including critical care time)  Medications Ordered in  ED Medications - No data to display   Initial Impression / Assessment and Plan / ED Course  I have reviewed the triage vital signs and the nursing notes.  Pertinent labs & imaging results that were available during my care of the patient were reviewed by me and considered in my medical decision making (see chart for details).       Durable Medical Equipment  (From admission, onward)        Start     Ordered   03/06/18 1514  For home use only DME standard manual wheelchair with seat cushion  Once    Comments:  Patient suffers from gait instability which impairs their ability to perform daily activities like bathing/going to bathroom in the home.  A walker will not resolve  issue with performing activities of daily living. A wheelchair will allow patient to safely perform daily activities. Patient can safely propel the wheelchair in the home or has a caregiver who can provide assistance.  Accessories: elevating leg rests (ELRs), wheel locks, extensions and anti-tippers.   03/06/18 1514      Final Clinical Impressions(s) / ED Diagnoses   Final diagnoses:  Frequent falls  Chronic pain of both knees  Imbalance   Patient is having gradual worsening of baseline function in terms of ability to transfer and basic mobility in the home.  Patient's daughter cares for the patient in the home.  She reports she does most of her personal caregiving.  She does benefit when physical therapy comes into the home.  Daughter was concerned today because it seems like the falls and weakness and pain due to knee arthritis are getting worse.  At this time, patient does not appear to have acute medical illness.  This appears more of a chronic worsening due to deconditioning and age with chronic knee pain.  Case management has been consulted and will be assisting with increased level of in-home care and assistive devices. ED Discharge Orders    None       Arby Barrette, MD 03/06/18 1521

## 2018-03-06 NOTE — ED Triage Notes (Signed)
Per [facility] pt from home for dizziness when she stands up x 4 days. Has knee pain that is chronic. Has dementia. Per [facility] pt unable to stand for them to get pt stand for orthostatic vitals

## 2018-03-06 NOTE — ED Notes (Signed)
Patient transported to X-ray 

## 2018-03-06 NOTE — ED Notes (Signed)
Wick put in place to collect urine sample 

## 2018-03-12 DIAGNOSIS — Z7982 Long term (current) use of aspirin: Secondary | ICD-10-CM | POA: Diagnosis not present

## 2018-03-12 DIAGNOSIS — M170 Bilateral primary osteoarthritis of knee: Secondary | ICD-10-CM | POA: Diagnosis not present

## 2018-03-12 DIAGNOSIS — Z87440 Personal history of urinary (tract) infections: Secondary | ICD-10-CM | POA: Diagnosis not present

## 2018-03-12 DIAGNOSIS — Z87891 Personal history of nicotine dependence: Secondary | ICD-10-CM | POA: Diagnosis not present

## 2018-03-12 DIAGNOSIS — G8929 Other chronic pain: Secondary | ICD-10-CM | POA: Diagnosis not present

## 2018-03-12 DIAGNOSIS — E559 Vitamin D deficiency, unspecified: Secondary | ICD-10-CM | POA: Diagnosis not present

## 2018-03-12 DIAGNOSIS — Z9181 History of falling: Secondary | ICD-10-CM | POA: Diagnosis not present

## 2018-03-12 DIAGNOSIS — E785 Hyperlipidemia, unspecified: Secondary | ICD-10-CM | POA: Diagnosis not present

## 2018-03-13 DIAGNOSIS — Z87440 Personal history of urinary (tract) infections: Secondary | ICD-10-CM | POA: Diagnosis not present

## 2018-03-13 DIAGNOSIS — E559 Vitamin D deficiency, unspecified: Secondary | ICD-10-CM | POA: Diagnosis not present

## 2018-03-13 DIAGNOSIS — Z87891 Personal history of nicotine dependence: Secondary | ICD-10-CM | POA: Diagnosis not present

## 2018-03-13 DIAGNOSIS — Z9181 History of falling: Secondary | ICD-10-CM | POA: Diagnosis not present

## 2018-03-13 DIAGNOSIS — M170 Bilateral primary osteoarthritis of knee: Secondary | ICD-10-CM | POA: Diagnosis not present

## 2018-03-13 DIAGNOSIS — E785 Hyperlipidemia, unspecified: Secondary | ICD-10-CM | POA: Diagnosis not present

## 2018-03-13 DIAGNOSIS — G8929 Other chronic pain: Secondary | ICD-10-CM | POA: Diagnosis not present

## 2018-03-13 DIAGNOSIS — Z7982 Long term (current) use of aspirin: Secondary | ICD-10-CM | POA: Diagnosis not present

## 2018-03-15 DIAGNOSIS — Z87440 Personal history of urinary (tract) infections: Secondary | ICD-10-CM | POA: Diagnosis not present

## 2018-03-15 DIAGNOSIS — Z9181 History of falling: Secondary | ICD-10-CM | POA: Diagnosis not present

## 2018-03-15 DIAGNOSIS — G8929 Other chronic pain: Secondary | ICD-10-CM | POA: Diagnosis not present

## 2018-03-15 DIAGNOSIS — E559 Vitamin D deficiency, unspecified: Secondary | ICD-10-CM | POA: Diagnosis not present

## 2018-03-15 DIAGNOSIS — E785 Hyperlipidemia, unspecified: Secondary | ICD-10-CM | POA: Diagnosis not present

## 2018-03-15 DIAGNOSIS — M170 Bilateral primary osteoarthritis of knee: Secondary | ICD-10-CM | POA: Diagnosis not present

## 2018-03-15 DIAGNOSIS — Z87891 Personal history of nicotine dependence: Secondary | ICD-10-CM | POA: Diagnosis not present

## 2018-03-15 DIAGNOSIS — Z7982 Long term (current) use of aspirin: Secondary | ICD-10-CM | POA: Diagnosis not present

## 2018-03-16 DIAGNOSIS — G8929 Other chronic pain: Secondary | ICD-10-CM | POA: Diagnosis not present

## 2018-03-16 DIAGNOSIS — Z87891 Personal history of nicotine dependence: Secondary | ICD-10-CM | POA: Diagnosis not present

## 2018-03-16 DIAGNOSIS — M170 Bilateral primary osteoarthritis of knee: Secondary | ICD-10-CM | POA: Diagnosis not present

## 2018-03-16 DIAGNOSIS — E785 Hyperlipidemia, unspecified: Secondary | ICD-10-CM | POA: Diagnosis not present

## 2018-03-16 DIAGNOSIS — E559 Vitamin D deficiency, unspecified: Secondary | ICD-10-CM | POA: Diagnosis not present

## 2018-03-16 DIAGNOSIS — Z9181 History of falling: Secondary | ICD-10-CM | POA: Diagnosis not present

## 2018-03-16 DIAGNOSIS — Z7982 Long term (current) use of aspirin: Secondary | ICD-10-CM | POA: Diagnosis not present

## 2018-03-16 DIAGNOSIS — Z87440 Personal history of urinary (tract) infections: Secondary | ICD-10-CM | POA: Diagnosis not present

## 2018-03-20 DIAGNOSIS — E559 Vitamin D deficiency, unspecified: Secondary | ICD-10-CM | POA: Diagnosis not present

## 2018-03-20 DIAGNOSIS — E785 Hyperlipidemia, unspecified: Secondary | ICD-10-CM | POA: Diagnosis not present

## 2018-03-20 DIAGNOSIS — G8929 Other chronic pain: Secondary | ICD-10-CM | POA: Diagnosis not present

## 2018-03-20 DIAGNOSIS — Z87891 Personal history of nicotine dependence: Secondary | ICD-10-CM | POA: Diagnosis not present

## 2018-03-20 DIAGNOSIS — Z87440 Personal history of urinary (tract) infections: Secondary | ICD-10-CM | POA: Diagnosis not present

## 2018-03-20 DIAGNOSIS — M170 Bilateral primary osteoarthritis of knee: Secondary | ICD-10-CM | POA: Diagnosis not present

## 2018-03-20 DIAGNOSIS — Z9181 History of falling: Secondary | ICD-10-CM | POA: Diagnosis not present

## 2018-03-20 DIAGNOSIS — Z7982 Long term (current) use of aspirin: Secondary | ICD-10-CM | POA: Diagnosis not present

## 2018-03-21 DIAGNOSIS — Z9181 History of falling: Secondary | ICD-10-CM | POA: Diagnosis not present

## 2018-03-21 DIAGNOSIS — G8929 Other chronic pain: Secondary | ICD-10-CM | POA: Diagnosis not present

## 2018-03-21 DIAGNOSIS — Z87891 Personal history of nicotine dependence: Secondary | ICD-10-CM | POA: Diagnosis not present

## 2018-03-21 DIAGNOSIS — E785 Hyperlipidemia, unspecified: Secondary | ICD-10-CM | POA: Diagnosis not present

## 2018-03-21 DIAGNOSIS — Z7982 Long term (current) use of aspirin: Secondary | ICD-10-CM | POA: Diagnosis not present

## 2018-03-21 DIAGNOSIS — E559 Vitamin D deficiency, unspecified: Secondary | ICD-10-CM | POA: Diagnosis not present

## 2018-03-21 DIAGNOSIS — M170 Bilateral primary osteoarthritis of knee: Secondary | ICD-10-CM | POA: Diagnosis not present

## 2018-03-21 DIAGNOSIS — Z87440 Personal history of urinary (tract) infections: Secondary | ICD-10-CM | POA: Diagnosis not present

## 2018-03-22 DIAGNOSIS — G8929 Other chronic pain: Secondary | ICD-10-CM | POA: Diagnosis not present

## 2018-03-22 DIAGNOSIS — Z87440 Personal history of urinary (tract) infections: Secondary | ICD-10-CM | POA: Diagnosis not present

## 2018-03-22 DIAGNOSIS — E785 Hyperlipidemia, unspecified: Secondary | ICD-10-CM | POA: Diagnosis not present

## 2018-03-22 DIAGNOSIS — E559 Vitamin D deficiency, unspecified: Secondary | ICD-10-CM | POA: Diagnosis not present

## 2018-03-22 DIAGNOSIS — Z87891 Personal history of nicotine dependence: Secondary | ICD-10-CM | POA: Diagnosis not present

## 2018-03-22 DIAGNOSIS — Z7982 Long term (current) use of aspirin: Secondary | ICD-10-CM | POA: Diagnosis not present

## 2018-03-22 DIAGNOSIS — M170 Bilateral primary osteoarthritis of knee: Secondary | ICD-10-CM | POA: Diagnosis not present

## 2018-03-22 DIAGNOSIS — Z9181 History of falling: Secondary | ICD-10-CM | POA: Diagnosis not present

## 2018-03-23 DIAGNOSIS — Z7982 Long term (current) use of aspirin: Secondary | ICD-10-CM | POA: Diagnosis not present

## 2018-03-23 DIAGNOSIS — Z87440 Personal history of urinary (tract) infections: Secondary | ICD-10-CM | POA: Diagnosis not present

## 2018-03-23 DIAGNOSIS — M170 Bilateral primary osteoarthritis of knee: Secondary | ICD-10-CM | POA: Diagnosis not present

## 2018-03-23 DIAGNOSIS — E559 Vitamin D deficiency, unspecified: Secondary | ICD-10-CM | POA: Diagnosis not present

## 2018-03-23 DIAGNOSIS — G8929 Other chronic pain: Secondary | ICD-10-CM | POA: Diagnosis not present

## 2018-03-23 DIAGNOSIS — Z9181 History of falling: Secondary | ICD-10-CM | POA: Diagnosis not present

## 2018-03-23 DIAGNOSIS — E785 Hyperlipidemia, unspecified: Secondary | ICD-10-CM | POA: Diagnosis not present

## 2018-03-23 DIAGNOSIS — Z87891 Personal history of nicotine dependence: Secondary | ICD-10-CM | POA: Diagnosis not present

## 2018-03-29 DIAGNOSIS — Z9181 History of falling: Secondary | ICD-10-CM | POA: Diagnosis not present

## 2018-03-29 DIAGNOSIS — E559 Vitamin D deficiency, unspecified: Secondary | ICD-10-CM | POA: Diagnosis not present

## 2018-03-29 DIAGNOSIS — Z87440 Personal history of urinary (tract) infections: Secondary | ICD-10-CM | POA: Diagnosis not present

## 2018-03-29 DIAGNOSIS — G8929 Other chronic pain: Secondary | ICD-10-CM | POA: Diagnosis not present

## 2018-03-29 DIAGNOSIS — Z87891 Personal history of nicotine dependence: Secondary | ICD-10-CM | POA: Diagnosis not present

## 2018-03-29 DIAGNOSIS — M170 Bilateral primary osteoarthritis of knee: Secondary | ICD-10-CM | POA: Diagnosis not present

## 2018-03-29 DIAGNOSIS — Z7982 Long term (current) use of aspirin: Secondary | ICD-10-CM | POA: Diagnosis not present

## 2018-03-29 DIAGNOSIS — E785 Hyperlipidemia, unspecified: Secondary | ICD-10-CM | POA: Diagnosis not present

## 2018-03-30 DIAGNOSIS — M170 Bilateral primary osteoarthritis of knee: Secondary | ICD-10-CM | POA: Diagnosis not present

## 2018-03-30 DIAGNOSIS — E785 Hyperlipidemia, unspecified: Secondary | ICD-10-CM | POA: Diagnosis not present

## 2018-03-30 DIAGNOSIS — Z9181 History of falling: Secondary | ICD-10-CM | POA: Diagnosis not present

## 2018-03-30 DIAGNOSIS — G8929 Other chronic pain: Secondary | ICD-10-CM | POA: Diagnosis not present

## 2018-03-30 DIAGNOSIS — E559 Vitamin D deficiency, unspecified: Secondary | ICD-10-CM | POA: Diagnosis not present

## 2018-03-30 DIAGNOSIS — Z87440 Personal history of urinary (tract) infections: Secondary | ICD-10-CM | POA: Diagnosis not present

## 2018-03-30 DIAGNOSIS — Z87891 Personal history of nicotine dependence: Secondary | ICD-10-CM | POA: Diagnosis not present

## 2018-03-30 DIAGNOSIS — Z7982 Long term (current) use of aspirin: Secondary | ICD-10-CM | POA: Diagnosis not present

## 2018-04-03 DIAGNOSIS — M170 Bilateral primary osteoarthritis of knee: Secondary | ICD-10-CM | POA: Diagnosis not present

## 2018-04-03 DIAGNOSIS — Z7982 Long term (current) use of aspirin: Secondary | ICD-10-CM | POA: Diagnosis not present

## 2018-04-03 DIAGNOSIS — E559 Vitamin D deficiency, unspecified: Secondary | ICD-10-CM | POA: Diagnosis not present

## 2018-04-03 DIAGNOSIS — Z9181 History of falling: Secondary | ICD-10-CM | POA: Diagnosis not present

## 2018-04-03 DIAGNOSIS — Z87891 Personal history of nicotine dependence: Secondary | ICD-10-CM | POA: Diagnosis not present

## 2018-04-03 DIAGNOSIS — Z87440 Personal history of urinary (tract) infections: Secondary | ICD-10-CM | POA: Diagnosis not present

## 2018-04-03 DIAGNOSIS — E785 Hyperlipidemia, unspecified: Secondary | ICD-10-CM | POA: Diagnosis not present

## 2018-04-03 DIAGNOSIS — G8929 Other chronic pain: Secondary | ICD-10-CM | POA: Diagnosis not present

## 2018-04-05 DIAGNOSIS — R262 Difficulty in walking, not elsewhere classified: Secondary | ICD-10-CM | POA: Diagnosis not present

## 2018-04-06 DIAGNOSIS — Z9181 History of falling: Secondary | ICD-10-CM | POA: Diagnosis not present

## 2018-04-06 DIAGNOSIS — E785 Hyperlipidemia, unspecified: Secondary | ICD-10-CM | POA: Diagnosis not present

## 2018-04-06 DIAGNOSIS — Z87891 Personal history of nicotine dependence: Secondary | ICD-10-CM | POA: Diagnosis not present

## 2018-04-06 DIAGNOSIS — E559 Vitamin D deficiency, unspecified: Secondary | ICD-10-CM | POA: Diagnosis not present

## 2018-04-06 DIAGNOSIS — G8929 Other chronic pain: Secondary | ICD-10-CM | POA: Diagnosis not present

## 2018-04-06 DIAGNOSIS — Z87440 Personal history of urinary (tract) infections: Secondary | ICD-10-CM | POA: Diagnosis not present

## 2018-04-06 DIAGNOSIS — Z7982 Long term (current) use of aspirin: Secondary | ICD-10-CM | POA: Diagnosis not present

## 2018-04-06 DIAGNOSIS — M170 Bilateral primary osteoarthritis of knee: Secondary | ICD-10-CM | POA: Diagnosis not present

## 2018-04-09 DIAGNOSIS — G8929 Other chronic pain: Secondary | ICD-10-CM | POA: Diagnosis not present

## 2018-04-09 DIAGNOSIS — E785 Hyperlipidemia, unspecified: Secondary | ICD-10-CM | POA: Diagnosis not present

## 2018-04-09 DIAGNOSIS — E559 Vitamin D deficiency, unspecified: Secondary | ICD-10-CM | POA: Diagnosis not present

## 2018-04-09 DIAGNOSIS — Z9181 History of falling: Secondary | ICD-10-CM | POA: Diagnosis not present

## 2018-04-09 DIAGNOSIS — Z87440 Personal history of urinary (tract) infections: Secondary | ICD-10-CM | POA: Diagnosis not present

## 2018-04-09 DIAGNOSIS — Z7982 Long term (current) use of aspirin: Secondary | ICD-10-CM | POA: Diagnosis not present

## 2018-04-09 DIAGNOSIS — Z87891 Personal history of nicotine dependence: Secondary | ICD-10-CM | POA: Diagnosis not present

## 2018-04-09 DIAGNOSIS — M170 Bilateral primary osteoarthritis of knee: Secondary | ICD-10-CM | POA: Diagnosis not present

## 2018-04-10 DIAGNOSIS — M170 Bilateral primary osteoarthritis of knee: Secondary | ICD-10-CM | POA: Diagnosis not present

## 2018-04-10 DIAGNOSIS — Z87891 Personal history of nicotine dependence: Secondary | ICD-10-CM | POA: Diagnosis not present

## 2018-04-10 DIAGNOSIS — Z7982 Long term (current) use of aspirin: Secondary | ICD-10-CM | POA: Diagnosis not present

## 2018-04-10 DIAGNOSIS — Z9181 History of falling: Secondary | ICD-10-CM | POA: Diagnosis not present

## 2018-04-10 DIAGNOSIS — G8929 Other chronic pain: Secondary | ICD-10-CM | POA: Diagnosis not present

## 2018-04-10 DIAGNOSIS — E559 Vitamin D deficiency, unspecified: Secondary | ICD-10-CM | POA: Diagnosis not present

## 2018-04-10 DIAGNOSIS — E785 Hyperlipidemia, unspecified: Secondary | ICD-10-CM | POA: Diagnosis not present

## 2018-04-10 DIAGNOSIS — Z87440 Personal history of urinary (tract) infections: Secondary | ICD-10-CM | POA: Diagnosis not present

## 2018-04-12 DIAGNOSIS — Z7982 Long term (current) use of aspirin: Secondary | ICD-10-CM | POA: Diagnosis not present

## 2018-04-12 DIAGNOSIS — E785 Hyperlipidemia, unspecified: Secondary | ICD-10-CM | POA: Diagnosis not present

## 2018-04-12 DIAGNOSIS — M170 Bilateral primary osteoarthritis of knee: Secondary | ICD-10-CM | POA: Diagnosis not present

## 2018-04-12 DIAGNOSIS — G8929 Other chronic pain: Secondary | ICD-10-CM | POA: Diagnosis not present

## 2018-04-12 DIAGNOSIS — Z87891 Personal history of nicotine dependence: Secondary | ICD-10-CM | POA: Diagnosis not present

## 2018-04-12 DIAGNOSIS — Z87440 Personal history of urinary (tract) infections: Secondary | ICD-10-CM | POA: Diagnosis not present

## 2018-04-12 DIAGNOSIS — E559 Vitamin D deficiency, unspecified: Secondary | ICD-10-CM | POA: Diagnosis not present

## 2018-04-12 DIAGNOSIS — Z9181 History of falling: Secondary | ICD-10-CM | POA: Diagnosis not present

## 2018-04-16 DIAGNOSIS — M6281 Muscle weakness (generalized): Secondary | ICD-10-CM | POA: Diagnosis not present

## 2018-04-16 DIAGNOSIS — M1990 Unspecified osteoarthritis, unspecified site: Secondary | ICD-10-CM | POA: Diagnosis not present

## 2018-04-16 DIAGNOSIS — Z0000 Encounter for general adult medical examination without abnormal findings: Secondary | ICD-10-CM | POA: Diagnosis not present

## 2018-04-20 DIAGNOSIS — Z7982 Long term (current) use of aspirin: Secondary | ICD-10-CM | POA: Diagnosis not present

## 2018-04-20 DIAGNOSIS — Z87440 Personal history of urinary (tract) infections: Secondary | ICD-10-CM | POA: Diagnosis not present

## 2018-04-20 DIAGNOSIS — M170 Bilateral primary osteoarthritis of knee: Secondary | ICD-10-CM | POA: Diagnosis not present

## 2018-04-20 DIAGNOSIS — E559 Vitamin D deficiency, unspecified: Secondary | ICD-10-CM | POA: Diagnosis not present

## 2018-04-20 DIAGNOSIS — Z87891 Personal history of nicotine dependence: Secondary | ICD-10-CM | POA: Diagnosis not present

## 2018-04-20 DIAGNOSIS — G8929 Other chronic pain: Secondary | ICD-10-CM | POA: Diagnosis not present

## 2018-04-20 DIAGNOSIS — E785 Hyperlipidemia, unspecified: Secondary | ICD-10-CM | POA: Diagnosis not present

## 2018-04-20 DIAGNOSIS — Z9181 History of falling: Secondary | ICD-10-CM | POA: Diagnosis not present

## 2018-04-27 DIAGNOSIS — Z87891 Personal history of nicotine dependence: Secondary | ICD-10-CM | POA: Diagnosis not present

## 2018-04-27 DIAGNOSIS — E559 Vitamin D deficiency, unspecified: Secondary | ICD-10-CM | POA: Diagnosis not present

## 2018-04-27 DIAGNOSIS — M170 Bilateral primary osteoarthritis of knee: Secondary | ICD-10-CM | POA: Diagnosis not present

## 2018-04-27 DIAGNOSIS — Z9181 History of falling: Secondary | ICD-10-CM | POA: Diagnosis not present

## 2018-04-27 DIAGNOSIS — G8929 Other chronic pain: Secondary | ICD-10-CM | POA: Diagnosis not present

## 2018-04-27 DIAGNOSIS — Z7982 Long term (current) use of aspirin: Secondary | ICD-10-CM | POA: Diagnosis not present

## 2018-04-27 DIAGNOSIS — Z87440 Personal history of urinary (tract) infections: Secondary | ICD-10-CM | POA: Diagnosis not present

## 2018-04-27 DIAGNOSIS — E785 Hyperlipidemia, unspecified: Secondary | ICD-10-CM | POA: Diagnosis not present

## 2018-05-06 DIAGNOSIS — R262 Difficulty in walking, not elsewhere classified: Secondary | ICD-10-CM | POA: Diagnosis not present

## 2018-05-18 ENCOUNTER — Encounter: Payer: Self-pay | Admitting: Physician Assistant

## 2018-05-18 ENCOUNTER — Other Ambulatory Visit: Payer: Self-pay

## 2018-05-18 ENCOUNTER — Ambulatory Visit (INDEPENDENT_AMBULATORY_CARE_PROVIDER_SITE_OTHER): Payer: Medicare Other | Admitting: Physician Assistant

## 2018-05-18 ENCOUNTER — Ambulatory Visit (INDEPENDENT_AMBULATORY_CARE_PROVIDER_SITE_OTHER): Payer: Medicare Other

## 2018-05-18 VITALS — BP 122/72 | HR 76 | Temp 97.6°F | Resp 16

## 2018-05-18 DIAGNOSIS — R229 Localized swelling, mass and lump, unspecified: Principal | ICD-10-CM

## 2018-05-18 DIAGNOSIS — M25511 Pain in right shoulder: Secondary | ICD-10-CM

## 2018-05-18 DIAGNOSIS — M6281 Muscle weakness (generalized): Secondary | ICD-10-CM | POA: Insufficient documentation

## 2018-05-18 DIAGNOSIS — R222 Localized swelling, mass and lump, trunk: Secondary | ICD-10-CM

## 2018-05-18 DIAGNOSIS — M1990 Unspecified osteoarthritis, unspecified site: Secondary | ICD-10-CM | POA: Insufficient documentation

## 2018-05-18 DIAGNOSIS — G8929 Other chronic pain: Secondary | ICD-10-CM | POA: Diagnosis not present

## 2018-05-18 DIAGNOSIS — F0390 Unspecified dementia without behavioral disturbance: Secondary | ICD-10-CM | POA: Insufficient documentation

## 2018-05-18 DIAGNOSIS — IMO0002 Reserved for concepts with insufficient information to code with codable children: Secondary | ICD-10-CM

## 2018-05-18 NOTE — Progress Notes (Signed)
Suzanne Case  MRN: [id number] DOB: 24-Dec-1926  Subjective:  Suzanne Case is a 84 y.o. female seen in office today for a chief complaint of mass on right side of back x 1 day.  Patient has PMH of dementia and is accompanied by her caregiver and daughter.  Denies redness, warmth, purulent drainage, fever, chills, nausea, and vomiting. Her caregiver noted it whenever she was showering her. Pt denies any pain.  Denies any injury to the back.  Would also like to have plain films of right shoulder.  Has had 2 falls in the past.  Was evaluated by ED after the last fall.  Per patient's daughter, they imaged all the aspects that were hurting except for the shoulder.  She has history of arthritis but is continued to have right shoulder pain since the fall.  Landed on her shoulder.  Denies numbness, tingling, and loss of range of motion.  Review of Systems  Constitutional: Negative for chills, diaphoresis, fever and unexpected weight change.  Gastrointestinal: Negative for nausea and vomiting.  Musculoskeletal: Negative for back pain.  Neurological: Negative for dizziness and light-headedness.    Patient Active Problem List   Diagnosis Date Noted  . Dementia 05/18/2018  . Arthritis 05/18/2018  . Muscle weakness 05/18/2018  . UTI (lower urinary tract infection) 05/24/2016  . Fever 05/24/2016  . Tachycardia 05/24/2016    Current Outpatient Medications on File Prior to Visit  Medication Sig Dispense Refill  . aspirin EC 81 MG tablet Take 81 mg by mouth daily.    . B Complex-C (B-COMPLEX WITH VITAMIN C) tablet Take 1 tablet by mouth daily.    . Cholecalciferol (VITAMIN D PO) Take by mouth.    . cholecalciferol (VITAMIN D) 1000 units tablet Take 2,000 Units by mouth daily.    Marland Kitchen. CINNAMON PO Take 5 mLs by mouth as directed. Daily with coffee.    . Coenzyme Q10 (COQ10 PO) Take 1 capsule by mouth daily.    Marland Kitchen. CRANBERRY PO Take by mouth.    Marland Kitchen. ketotifen (ZADITOR) 0.025 % ophthalmic  solution Place 1 drop into both eyes daily as needed (allergies).    . Multiple Vitamin (MULTIVITAMIN WITH MINERALS) TABS tablet Take 1 tablet by mouth daily.    . naproxen sodium (ALEVE) 220 MG tablet Take 220 mg by mouth daily as needed (pain).    . TURMERIC PO Take 1 capsule by mouth daily.    . vitamin C (ASCORBIC ACID) 500 MG tablet Take 500 mg by mouth daily.    Marland Kitchen. VITAMIN E PO Take 1 tablet by mouth daily.     No current facility-administered medications on file prior to visit.     No Known Allergies    Social History   Socioeconomic History  . Marital status: Widowed    Spouse name: Not on file  . Number of children: Not on file  . Years of education: Not on file  . Highest education level: Not on file  Occupational History  . Not on file  Social Needs  . Financial resource strain: Not on file  . Food insecurity:    Worry: Not on file    Inability: Not on file  . Transportation needs:    Medical: Not on file    Non-medical: Not on file  Tobacco Use  . Smoking status: Former Games developermoker  . Smokeless tobacco: Never Used  Substance and Sexual Activity  . Alcohol use: No    Alcohol/week: 0.0 oz  .  Drug use: No  . Sexual activity: Not on file  Lifestyle  . Physical activity:    Days per week: Not on file    Minutes per session: Not on file  . Stress: Not on file  Relationships  . Social connections:    Talks on phone: Not on file    Gets together: Not on file    Attends religious service: Not on file    Active member of club or organization: Not on file    Attends meetings of clubs or organizations: Not on file    Relationship status: Not on file  . Intimate partner violence:    Fear of current or ex partner: Not on file    Emotionally abused: Not on file    Physically abused: Not on file    Forced sexual activity: Not on file  Other Topics Concern  . Not on file  Social History Narrative  . Not on file    Objective:  BP 122/72   Pulse 76   Temp 97.6 F  (36.4 C) (Oral)   Resp 16   SpO2 98%   Physical Exam  Constitutional: She appears well-developed and well-nourished.  Pleasant female sitting in wheelchair.   HENT:  Head: Normocephalic and atraumatic.  Eyes: Conjunctivae are normal.  Neck: Normal range of motion.  Pulmonary/Chest: Effort normal.  Musculoskeletal:       Right shoulder: She exhibits tenderness (with palpation overlying humeral head) and crepitus. She exhibits normal range of motion, no swelling, no spasm, normal pulse and normal strength.       Thoracic back: Normal.       Lumbar back: She exhibits no tenderness.  Lumbar paraspinal musculature is tight to palpation b/l.   Neurological: She is alert.  Skin: Skin is warm and dry.     Psychiatric: She has a normal mood and affect.  Vitals reviewed.  Dg Shoulder Right  Result Date: 05/18/2018 CLINICAL DATA:  Right shoulder pain since a fall 1 month ago. Initial encounter. EXAM: RIGHT SHOULDER - 2+ VIEW COMPARISON:  None. FINDINGS: There is no acute bony or joint abnormality. Moderately severe acromioclavicular and mild-to-moderate glenohumeral osteoarthritis is seen. No focal bony lesion. Imaged right lung and ribs appear normal. IMPRESSION: No acute abnormality. Acromioclavicular worse than glenohumeral osteoarthritis. Electronically Signed   By: Drusilla Kanner M.D.   On: 05/18/2018 14:29    Assessment and Plan :  1. Mass This Case was precepted with Dr. Katrinka Blazing, who also examined the patient. Suspect lipoma. No concerning signs for overlying infection. Since it is not causing pt any issues at this time, recommend monitoring.   Do not suggest any further imaging or evaluation at this time.  If affected areas get larger or area begins  to be bothersome to patient, recommend possible ultrasound.Does have underlying muscle tightness, which could be due to pt sitting most of the day. Recommend heating pad to affected area. Advised to return to clinic if symptoms worsen,  become bothersome, or as needed.   2. Chronic right shoulder pain -No acute abnormality. OA noted.  - DG Shoulder Right; Future  Benjiman Core PA-C  Primary [facility] at [facility] 05/18/2018 2:38 PM

## 2018-05-18 NOTE — Patient Instructions (Addendum)
For back swelling, I recommend using heating pad (not too hot) on affected area 4-5 x a day for 15 minutes at a time. If it gets much bigger or becomes painful please let your doctor now.  We will contact you with your xray results.   Thank you for letting me participate in your health and well being.   Lipoma A lipoma is a noncancerous (benign) tumor that is made up of fat cells. This is a very common type of soft-tissue growth. Lipomas are usually found under the skin (subcutaneous). They may occur in any tissue of the body that contains fat. Common areas for lipomas to appear include the back, shoulders, buttocks, and thighs. Lipomas grow slowly, and they are usually painless. Most lipomas do not cause problems and do not require treatment. What are the causes? The cause of this condition is not known. What increases the risk? This condition is more likely to develop in:  People who are [id number] years old.  People who have a family history of lipomas.  What are the signs or symptoms? A lipoma usually appears as a small, round bump under the skin. It may feel soft or rubbery, but the firmness can vary. Most lipomas are not painful. However, a lipoma may become painful if it is located in an area where it pushes on nerves. How is this diagnosed? A lipoma can usually be diagnosed with a physical exam. You may also have tests to confirm the diagnosis and to rule out other conditions. Tests may include:  Imaging tests, such as a CT scan or MRI.  Removal of a tissue sample to be looked at under a microscope (biopsy).  How is this treated? Treatment is not needed for small lipomas that are not causing problems. If a lipoma continues to get bigger or it causes problems, removal is often the best option. Lipomas can also be removed to improve appearance. Removal of a lipoma is usually done with a surgery in which the fatty cells and the surrounding capsule are removed. Most often, a medicine  that numbs the area (local anesthetic) is used for this procedure. Follow these instructions at home:  Keep all follow-up visits as directed by your health care provider. This is important. Contact a health care provider if:  Your lipoma becomes larger or hard.  Your lipoma becomes painful, red, or increasingly swollen. These could be signs of infection or a more serious condition. This information is not intended to replace advice given to you by your health care provider. Make sure you discuss any questions you have with your health care provider. Document Released: 10/14/2002 Document Revised: 03/31/2016 Document Reviewed: 10/20/2014 Elsevier Interactive Patient Education  2018 [employer]   IF you received an x-ray today, you will receive an invoice from [facility]. Please contact [facility] at [phone] with questions or concerns regarding your invoice.   IF you received labwork today, you will receive an invoice from [facility]. Please contact [facility] at [phone] with questions or concerns regarding your invoice.   Our billing staff will not be able to assist you with questions regarding bills from these companies.  You will be contacted with the lab results as soon as they are available. The fastest way to get your results is to activate your My Chart account. Instructions are located on the last page of this paperwork. If you have not heard from us regarding the results in 2 weeks, please contact this office.

## 2018-05-23 ENCOUNTER — Telehealth: Payer: Self-pay | Admitting: Physician Assistant

## 2018-05-23 NOTE — Telephone Encounter (Signed)
I called and talked with patient's daughter and let her know that x-ray did not show any acute abnormality. No fracture dislocation. It does show osteoarthritis in-which they are aware of.  Pt's daughter states thank you and  understanding

## 2018-05-23 NOTE — Telephone Encounter (Signed)
Please call patient's daughter and let her know that her shoulder x-ray did not show any acute abnormality. No fracture dislocation which is reassuring. She does have osteoarthritis which they were aware of.

## 2018-05-23 NOTE — Telephone Encounter (Signed)
Please advise: CRM for notification. See Telephone encounter for: 05/23/18. Pts daughter calling to get xray results for her mom.

## 2018-05-23 NOTE — Telephone Encounter (Signed)
Copied from CRM [id number]. Topic: Quick Communication - See Telephone Encounter >> May 23, 2018 10:05 AM Arlyss Gandyichardson, Mahogany Torrance N, NT wrote: CRM for notification. See Telephone encounter for: 05/23/18. Pts daughter calling to get xray results for her mom.

## 2018-05-24 ENCOUNTER — Emergency Department (HOSPITAL_COMMUNITY): Payer: Medicare Other

## 2018-05-24 ENCOUNTER — Emergency Department (HOSPITAL_COMMUNITY)
Admission: EM | Admit: 2018-05-24 | Discharge: 2018-05-24 | Disposition: A | Discharge status: Home / Self Care | Payer: Medicare Other | Attending: Emergency Medicine | Admitting: Emergency Medicine

## 2018-05-24 ENCOUNTER — Encounter (HOSPITAL_COMMUNITY): Payer: Self-pay | Admitting: *Deleted

## 2018-05-24 ENCOUNTER — Other Ambulatory Visit (HOSPITAL_COMMUNITY): Payer: Medicare Other

## 2018-05-24 DIAGNOSIS — F0390 Unspecified dementia without behavioral disturbance: Secondary | ICD-10-CM | POA: Diagnosis not present

## 2018-05-24 DIAGNOSIS — Z79899 Other long term (current) drug therapy: Secondary | ICD-10-CM | POA: Diagnosis not present

## 2018-05-24 DIAGNOSIS — Z87891 Personal history of nicotine dependence: Secondary | ICD-10-CM | POA: Diagnosis not present

## 2018-05-24 DIAGNOSIS — Z7982 Long term (current) use of aspirin: Secondary | ICD-10-CM | POA: Insufficient documentation

## 2018-05-24 DIAGNOSIS — R42 Dizziness and giddiness: Secondary | ICD-10-CM

## 2018-05-24 LAB — URINALYSIS, ROUTINE W REFLEX MICROSCOPIC
BILIRUBIN URINE: NEGATIVE
Glucose, UA: NEGATIVE mg/dL
KETONES UR: 20 mg/dL — AB
Nitrite: POSITIVE — AB
Protein, ur: 30 mg/dL — AB
Specific Gravity, Urine: 1.017 (ref 1.005–1.030)
pH: 6 (ref 5.0–8.0)

## 2018-05-24 LAB — COMPREHENSIVE METABOLIC PANEL
ALT: 15 U/L (ref 0–44)
AST: 22 U/L (ref 15–41)
Albumin: 3.3 g/dL — ABNORMAL LOW (ref 3.5–5.0)
Alkaline Phosphatase: 53 U/L (ref 38–126)
Anion gap: 9 (ref 5–15)
BUN: 28 mg/dL — AB (ref 8–23)
CHLORIDE: 116 mmol/L — AB (ref 98–111)
CO2: 23 mmol/L (ref 22–32)
Calcium: 8.8 mg/dL — ABNORMAL LOW (ref 8.9–10.3)
Creatinine, Ser: 0.49 mg/dL (ref 0.44–1.00)
Glucose, Bld: 88 mg/dL (ref 70–99)
POTASSIUM: 4.4 mmol/L (ref 3.5–5.1)
SODIUM: 148 mmol/L — AB (ref 135–145)
Total Bilirubin: 0.4 mg/dL (ref 0.3–1.2)
Total Protein: 6.2 g/dL — ABNORMAL LOW (ref 6.5–8.1)

## 2018-05-24 LAB — CBC WITH DIFFERENTIAL/PLATELET
BASOS ABS: 0 10*3/uL (ref 0.0–0.1)
Basophils Relative: 0 %
EOS ABS: 0 10*3/uL (ref 0.0–0.7)
EOS PCT: 0 %
HCT: 38.2 % (ref 36.0–46.0)
Hemoglobin: 11.9 g/dL — ABNORMAL LOW (ref 12.0–15.0)
LYMPHS PCT: 19 %
Lymphs Abs: 1 10*3/uL (ref 0.7–4.0)
MCH: 26.2 pg (ref 26.0–34.0)
MCHC: 31.2 g/dL (ref 30.0–36.0)
MCV: 84.1 fL (ref 78.0–100.0)
MONO ABS: 0.3 10*3/uL (ref 0.1–1.0)
Monocytes Relative: 5 %
Neutro Abs: 4 10*3/uL (ref 1.7–7.7)
Neutrophils Relative %: 76 %
PLATELETS: 222 10*3/uL (ref 150–400)
RBC: 4.54 MIL/uL (ref 3.87–5.11)
RDW: 14.8 % (ref 11.5–15.5)
WBC: 5.3 10*3/uL (ref 4.0–10.5)

## 2018-05-24 MED ORDER — CEPHALEXIN 500 MG PO CAPS: 500.0000 mg | ORAL_CAPSULE | Freq: Three times a day (TID) | ORAL | 0 refills | Status: DC

## 2018-05-24 MED ORDER — SODIUM CHLORIDE 0.9 % IV BOLUS
1000.0000 mL | Freq: Once | INTRAVENOUS | Status: AC
  Administered 2018-05-24: 1000 mL via INTRAVENOUS

## 2018-05-24 MED ORDER — CEPHALEXIN 500 MG PO CAPS
500.0000 mg | ORAL_CAPSULE | Freq: Once | ORAL | Status: AC
  Administered 2018-05-24: 500 mg via ORAL
  Filled 2018-05-24: qty 1

## 2018-05-24 NOTE — ED Notes (Signed)
Bed: [id number] Expected date:  Expected time:  Means of arrival:  Comments: EMS-pain/dizziness

## 2018-05-24 NOTE — ED Notes (Signed)
Pt placed on bedpan

## 2018-05-24 NOTE — ED Notes (Signed)
Patient transported to X-ray 

## 2018-05-24 NOTE — ED Triage Notes (Signed)
Per EMS, pt from home complains of knee pain and dizziness since standing up and going to bathroom. Pt felt better when lying on EMS stretcher. Pt was A&O x 3, has hx of dementia. EKG sinus rhythm.   BP 140/66 HR 74 CBG 120

## 2018-05-27 LAB — URINE CULTURE: Culture: >=100000 — AB

## 2018-05-28 ENCOUNTER — Telehealth: Payer: Self-pay | Admitting: Emergency Medicine

## 2018-05-28 NOTE — Telephone Encounter (Signed)
Post ED Visit - Positive Culture Follow-up  Culture report reviewed by antimicrobial stewardship pharmacist:  []  Enzo BiNathan Batchelder, Pharm.D. []  Celedonio MiyamotoJeremy Frens, Pharm.D., BCPS AQ-ID []  Garvin FilaMike Maccia, Pharm.D., BCPS []  Georgina PillionElizabeth Martin, Pharm.D., BCPS []  KinstonMinh Pham, [address] BoulevardPharm.D., BCPS, AAHIVP []  Estella HuskMichelle Turner, Pharm.D., BCPS, AAHIVP [x]  Lysle Pearlachel Rumbarger, PharmD, BCPS []  Phillips Climeshuy Dang, PharmD, BCPS []  Agapito GamesAlison Masters, PharmD, BCPS []  Verlan FriendsErin Deja, PharmD  Positive urine culture Treated with cephalexin, organism sensitive to the same and no further patient follow-up is required at this time.  Berle MullMiller, Aniketh Huberty 05/28/2018, 11:55 AM

## 2018-05-29 NOTE — ED Provider Notes (Signed)
[facility]-EMERGENCY DEPT Provider Note   CSN: [id number] Arrival date & time: 05/24/18  1140     History   Chief Complaint Chief Complaint  Patient presents with  . Knee Pain  . Dizziness    HPI Vertis KelchJessie Mae Camillia HerterBonaparte is a 84 y.o. female.  HPI Patient is a 84 year old female with a history of dementia who is brought to the emergency department with complaints of dizziness and reported knee pain.  She apparently was sitting on the commode this morning when she developed knee pain and was unable to get off the commode and became dizzy and lightheaded.  No chest pain or shortness of breath.  No palpitations.  Patient feels much better at this time.  No lightheadedness at this time.  Brought to the emergency department via EMS with a blood pressure of 140/66.  Her CBG was 120 on EMS arrival.  Past Medical History:  Diagnosis Date  . Arthritis     Patient Active Problem List   Diagnosis Date Noted  . Dementia 05/18/2018  . Arthritis 05/18/2018  . Muscle weakness 05/18/2018  . UTI (lower urinary tract infection) 05/24/2016  . Fever 05/24/2016  . Tachycardia 05/24/2016    Past Surgical History:  Procedure Laterality Date  . CESAREAN SECTION       OB History   None      Home Medications    Prior to Admission medications   Medication Sig Start Date End Date Taking? Authorizing Provider  aspirin EC 81 MG tablet Take 81 mg by mouth daily.   Yes [provider]  B Complex-C (B-COMPLEX WITH VITAMIN C) tablet Take 1 tablet by mouth daily.   Yes [provider]  Cholecalciferol (VITAMIN D PO) Take by mouth.   Yes [provider]  CINNAMON PO Take 5 mLs by mouth as directed. Daily with coffee.   Yes [provider]  Coenzyme Q10 (COQ10 PO) Take 1 capsule by mouth daily.   Yes [provider]  CRANBERRY PO Take by mouth.   Yes [provider]  Glucosamine-Chondroitin (GLUCOSAMINE CHONDR COMPLEX PO)  Take 1 tablet by mouth daily.   Yes [provider]  Multiple Vitamin (MULTIVITAMIN WITH MINERALS) TABS tablet Take 1 tablet by mouth daily.   Yes [provider]  naproxen sodium (ALEVE) 220 MG tablet Take 220 mg by mouth daily as needed (pain).   Yes [provider]  TURMERIC PO Take 1 capsule by mouth daily.   Yes [provider]  vitamin C (ASCORBIC ACID) 500 MG tablet Take 500 mg by mouth daily.   Yes [provider]  VITAMIN E PO Take 1 tablet by mouth daily.   Yes [provider]  cephALEXin (KEFLEX) 500 MG capsule Take 1 capsule (500 mg total) by mouth 3 (three) times daily. 05/24/18   Azalia Bilisampos, Keiyon Plack, MD    Family History Family History  Problem Relation Age of Onset  . Diabetes Mother   . Heart disease Mother   . Hypertension Mother   . Diabetes Father   . Heart disease Father   . Hypertension Father   . Diabetes Sister   . Heart disease Sister   . Hypertension Sister     Social History Social History   Tobacco Use  . Smoking status: Former Games developermoker  . Smokeless tobacco: Never Used  Substance Use Topics  . Alcohol use: No    Alcohol/week: 0.0 oz  . Drug use: No  Allergies   Patient has no known allergies.   Review of Systems Review of Systems  All other systems reviewed and are negative.    Physical Exam Updated Vital Signs BP 110/77 (BP Location: Right Arm)   Pulse 98   Temp 97.7 F (36.5 C) (Oral)   Resp 12   SpO2 100%   Physical Exam  Constitutional: She is oriented to person, place, and time. She appears well-developed and well-nourished. No distress.  HENT:  Head: Normocephalic and atraumatic.  Eyes: EOM are normal.  Neck: Normal range of motion.  Cardiovascular: Normal rate, regular rhythm and normal heart sounds.  Pulmonary/Chest: Effort normal and breath sounds normal.  Abdominal: Soft. She exhibits no distension. There is no tenderness.  Musculoskeletal: Normal range of motion.    Full range of motion bilateral hips, knees, ankles. No weakness of arms or legs  Neurological: She is alert and oriented to person, place, and time.  Skin: Skin is warm and dry.  Psychiatric: She has a normal mood and affect. Judgment normal.  Nursing note and vitals reviewed.    ED Treatments / Results  Labs (all labs ordered are listed, but only abnormal results are displayed) Labs Reviewed  URINE CULTURE - Abnormal; Notable for the following components:      Result Value                 All other components within normal limits  CBC WITH DIFFERENTIAL/PLATELET - Abnormal; Notable for the following components:   Hemoglobin 11.9 (*)    All other components within normal limits  COMPREHENSIVE METABOLIC PANEL - Abnormal; Notable for the following components:   Sodium 148 (*)    Chloride 116 (*)    BUN 28 (*)    Calcium 8.8 (*)    Total Protein 6.2 (*)    Albumin 3.3 (*)    All other components within normal limits  URINALYSIS, ROUTINE W REFLEX MICROSCOPIC - Abnormal; Notable for the following components:   APPearance CLOUDY (*)    Hgb urine dipstick MODERATE (*)    Ketones, ur 20 (*)    Protein, ur 30 (*)    Nitrite POSITIVE (*)    Leukocytes, UA LARGE (*)    RBC / HPF >50 (*)    WBC, UA >50 (*)    Bacteria, UA FEW (*)    All other components within normal limits    EKG None  Radiology No results found.  Procedures Procedures (including critical care time)  Medications Ordered in ED Medications  sodium chloride 0.9 % bolus 1,000 mL (0 mLs Intravenous Stopped 05/24/18 1509)  cephALEXin (KEFLEX) capsule 500 mg (500 mg Oral Given 05/24/18 1656)     Initial Impression / Assessment and Plan / ED Course  I have reviewed the triage vital signs and the nursing notes.  Pertinent labs & imaging results that were available during my care of the patient were reviewed by me and considered in my medical decision making (see chart for details).     Well appearing. Full  ROM of major joints. Generalized weakness may be attributed to UTI.  Urine culture sent.  Keflex given.  Primary care follow-up.  No indication for additional work-up.  No indication for advanced imaging.  Final Clinical Impressions(s) / ED Diagnoses   Final diagnoses:  Lightheadedness    ED Discharge Orders        Ordered    cephALEXin (KEFLEX) 500 MG capsule  3 times daily  05/24/18 1653       Azalia Bilis, MD 05/29/18 2332

## 2018-06-07 ENCOUNTER — Ambulatory Visit (INDEPENDENT_AMBULATORY_CARE_PROVIDER_SITE_OTHER): Payer: Medicare Other | Admitting: Family Medicine

## 2018-06-07 ENCOUNTER — Encounter: Payer: Self-pay | Admitting: Family Medicine

## 2018-06-07 ENCOUNTER — Other Ambulatory Visit: Payer: Self-pay

## 2018-06-07 VITALS — BP 135/75 | HR 76 | Temp 97.5°F | Resp 16 | Ht 60.0 in | Wt 173.2 lb

## 2018-06-07 DIAGNOSIS — J069 Acute upper respiratory infection, unspecified: Secondary | ICD-10-CM

## 2018-06-07 DIAGNOSIS — R6889 Other general symptoms and signs: Secondary | ICD-10-CM | POA: Diagnosis not present

## 2018-06-07 DIAGNOSIS — M1990 Unspecified osteoarthritis, unspecified site: Secondary | ICD-10-CM | POA: Diagnosis not present

## 2018-06-07 DIAGNOSIS — F0390 Unspecified dementia without behavioral disturbance: Secondary | ICD-10-CM | POA: Diagnosis not present

## 2018-06-07 DIAGNOSIS — Z9181 History of falling: Secondary | ICD-10-CM

## 2018-06-07 NOTE — Patient Instructions (Addendum)
IF you received an x-ray today, you will receive an invoice from [facility]. Please contact [facility] at [phone] with questions or concerns regarding your invoice.   IF you received labwork today, you will receive an invoice from [facility]. Please contact [facility] at [phone] with questions or concerns regarding your invoice.   Our billing staff will not be able to assist you with questions regarding bills from these companies.  You will be contacted with the lab results as soon as they are available. The fastest way to get your results is to activate your My Chart account. Instructions are located on the last page of this paperwork. If you have not heard from [place] regarding the results in 2 weeks, please contact this office.     Dementia Dementia is the loss of two or more brain functions, such as:  Memory.  Decision making.  Behavior.  Speaking.  Thinking.  Problem solving.  There are many types of dementia. The most common type is called progressive dementia. Progressive dementia gets worse with time and it is irreversible. An example of this type of dementia is Alzheimer disease. What are the causes? This condition may be caused by:  Nerve cell damage in the brain.  Genetic mutations.  Certain medicines.  Multiple small strokes.  An infection, such as chronic meningitis.  A metabolic problem, such as vitamin B12 deficiency or thyroid disease.  Pressure on the brain, such as from a tumor or blood clot.  What are the signs or symptoms? Symptoms of this condition include:  Sudden changes in mood.  Depression.  Problems with balance.  Changes in personality.  Poor short-term memory.  Agitation.  Delusions.  Hallucinations.  Having a hard time: ? Speaking thoughts. ? Finding words. ? Solving problems. ? Doing familiar tasks. ? Understanding familiar ideas.  How is this diagnosed? This condition is diagnosed with  an assessment by your health care provider. During this assessment, your health care provider will talk with you and your family, friends, or caregivers about your symptoms. A thorough medical history will be taken, and you will have a physical exam and tests. Tests may include:  Lab tests, such as blood or urine tests.  Imaging tests, such as a CT scan, PET scan, or MRI.  A lumbar puncture. This test involves removing and testing a small amount of the fluid that surrounds the brain and spinal cord.  An electroencephalogram (EEG). In this test, small metal discs are used to measure electrical activity in the brain.  Memory tests, cognitive tests, and neuropsychological tests. These tests evaluate brain function.  How is this treated? Treatment depends on the cause of the dementia. It may involve taking medicines that may help:  To control the dementia.  To slow down the disease.  To manage symptoms.  In some cases, treating the cause of the dementia can improve symptoms, reverse symptoms, or slow down how quickly the dementia gets worse. Your health care provider can help direct you to support groups, organizations, and other health care providers who can help with decisions about your care. Follow these instructions at home: Medicine  Take over-the-counter and prescription medicines only as told by your health care provider.  Avoid taking medicines that can affect thinking, such as pain or sleeping medicines. Lifestyle   Make healthy lifestyle choices: ? Be physically active as told by your health care provider. ? Do not use any tobacco products, such as cigarettes, chewing tobacco, and e-cigarettes. If  you need help quitting, ask your health care provider. ? Eat a healthy diet. ? Practice stress-management techniques when you get stressed. ? Stay social.  Drink enough fluid to keep your urine clear or pale yellow.  Make sure to get quality sleep. These tips can help you to  get a good night's rest: ? Avoid napping during the day. ? Keep your sleeping area dark and cool. ? Avoid exercising during the few hours before you go to bed. ? Avoid caffeine products in the evening. General instructions  Work with your health care provider to determine what you need help with and what your safety needs are.  If you were given a bracelet that tracks your location, make sure to wear it.  Keep all follow-up visits as told by your health care provider. This is important. Contact a health care provider if:  You have any new symptoms.  You have problems with choking or swallowing.  You have any symptoms of a different illness. Get help right away if:  You develop a fever.  You have new or worsening confusion.  You have new or worsening sleepiness.  You have a hard time staying awake.  You or your family members become concerned for your safety. This information is not intended to replace advice given to you by your health care provider. Make sure you discuss any questions you have with your health care provider. Document Released: 04/19/2001 Document Revised: 03/03/2016 Document Reviewed: 07/22/2015 Elsevier Interactive Patient Education  [employer]2018 [employer] Inc[employer]

## 2018-06-07 NOTE — Progress Notes (Signed)
Chief Complaint  Patient presents with  . New Patient (Initial Visit)    establish care.  Pt has dementia.  Bone to bone arthritis, hands, hip and shoulder.    HPI   Acute UTI Patient reports that she was in the ER for urosepsis She has improved since being on keflex She never gets UTI symptoms but her altered mental status is how the family knew something was wrong.  She does not have a history of recurrent UTI  Dementia She can communicate but cannot recall her DOB,  She lives with her daughter who is her POA She has not self-injurous behavior and does not wander. She cannot perform her IADLs but with assistance can perform her ADL.  Her family is very attentive and helps with showers and meals.  She wears depends to help with her incontinence.  Arthritis She reports that she has pain in her hands She has a compounding cream that is made at the pharmacy that she uses that helps her hand and knee pain.  She takes naproxen occasionally. She takes very few pain pills but uses Glucosamine-Chondroitin. She falls at home quite a bit. Lab Results  Component Value Date   CREATININE 0.49 05/24/2018      Past Medical History:  Diagnosis Date  . Arthritis     Current Outpatient Medications  Medication Sig Dispense Refill  . aspirin EC 81 MG tablet Take 81 mg by mouth daily.    . B Complex-C (B-COMPLEX WITH VITAMIN C) tablet Take 1 tablet by mouth daily.    . Cholecalciferol (VITAMIN D PO) Take by mouth.    Marland Kitchen CINNAMON PO Take 5 mLs by mouth as directed. Daily with coffee.    . Coenzyme Q10 (COQ10 PO) Take 1 capsule by mouth daily.    Marland Kitchen CRANBERRY PO Take by mouth.    . Glucosamine-Chondroitin (GLUCOSAMINE CHONDR COMPLEX PO) Take 1 tablet by mouth daily.    . Multiple Vitamin (MULTIVITAMIN WITH MINERALS) TABS tablet Take 1 tablet by mouth daily.    . naproxen sodium (ALEVE) 220 MG tablet Take 220 mg by mouth daily as needed (pain).    . TURMERIC PO Take 1 capsule by mouth  daily.    . vitamin C (ASCORBIC ACID) 500 MG tablet Take 500 mg by mouth daily.    Marland Kitchen VITAMIN E PO Take 1 tablet by mouth daily.     No current facility-administered medications for this visit.     Allergies: No Known Allergies  Past Surgical History:  Procedure Laterality Date  . CESAREAN SECTION      Social History   Socioeconomic History  . Marital status: Widowed    Spouse name: Not on file  . Number of children: Not on file  . Years of education: Not on file  . Highest education level: Not on file  Occupational History  . Not on file  Social Needs  . Financial resource strain: Not on file  . Food insecurity:    Worry: Not on file    Inability: Not on file  . Transportation needs:    Medical: Not on file    Non-medical: Not on file  Tobacco Use  . Smoking status: Former Games developer  . Smokeless tobacco: Never Used  Substance and Sexual Activity  . Alcohol use: No    Alcohol/week: 0.0 oz  . Drug use: No  . Sexual activity: Not on file  Lifestyle  . Physical activity:    Days per week: Not on  file    Minutes per session: Not on file  . Stress: Not on file  Relationships  . Social connections:    Talks on phone: Not on file    Gets together: Not on file    Attends religious service: Not on file    Active member of club or organization: Not on file    Attends meetings of clubs or organizations: Not on file    Relationship status: Not on file  Other Topics Concern  . Not on file  Social History Narrative  . Not on file    Family History  Problem Relation Age of Onset  . Diabetes Mother   . Heart disease Mother   . Hypertension Mother   . Diabetes Father   . Heart disease Father   . Hypertension Father   . Diabetes Sister   . Heart disease Sister   . Hypertension Sister      ROS Review of Systems See HPI Constitution: No fevers or chills No malaise No diaphoresis Skin: No rash or itching Eyes: no blurry vision, no double vision GU: no dysuria  or hematuria Neuro: no dizziness or headaches  all others reviewed and negative   Objective: Vitals:   06/07/18 1202  BP: 135/75  Pulse: 76  Resp: 16  Temp: (!) 97.5 F (36.4 C)  TempSrc: Oral  SpO2: 96%  Weight: 173 lb 3.2 oz (78.6 kg)  Height: 5' (1.524 m)    Physical Exam  Constitutional: She is oriented to person, place, and time. She appears well-developed and well-nourished.  HENT:  Head: Normocephalic and atraumatic.  Eyes: Conjunctivae and EOM are normal.  Neck: Normal range of motion. Neck supple.  Cardiovascular: Normal rate, regular rhythm and normal heart sounds.  No murmur heard. Pulmonary/Chest: Effort normal and breath sounds normal. No stridor. No respiratory distress. She has no wheezes.  Musculoskeletal: Normal range of motion. She exhibits no edema.  Neurological: She is alert and oriented to person, place, and time. No cranial nerve deficit.  Skin: Skin is warm. Capillary refill takes less than 2 seconds.  Psychiatric: She has a normal mood and affect. Her behavior is normal. Judgment and thought content normal.    Clinical Data:  Nausea and vomiting.  Dizziness.   HEAD CT WITHOUT CONTRAST: Technique:  Contiguous axial images were obtained from the base of the skull through the vertex according to standard protocol without contrast. Findings:  No intracranial hemorrhage, mass or findings to strongly suggest an acute infarction.  Subcortical and deep white matter hypoattenuation frontal parietal and posterior parietal occipital lobar territories.  Extensive calcification cavernous segments ICA(s).   IMPRESSION: No acute intracranial abnormality.  Findings compatible with chronic small vessel ischemic disease of the cerebral white matter.    Provider: Berton BonAllyson Sears, Amaryllis DykeKelly Blake  Assessment and Plan Suzanne KelchJessie Case was seen today for new patient (initial visit).  Diagnoses and all orders for this visit:  Dementia without behavioral disturbance, unspecified  dementia type -   Discussed falls risks, nutritional support -   Discussed fall prevention  Difficulty transferring/High Risk of Falls -    Discussed that I will send home PT to evaluate if the home is equipped to prevent falls  Arthritis -   Advised to call with the name of the compounding cream for a refill    Acute URI -    Discussed that urosepsis worsens dementia and AMS may be the only sign    Suzanne Case

## 2018-06-11 ENCOUNTER — Encounter: Payer: Self-pay | Admitting: Family Medicine

## 2018-08-06 DIAGNOSIS — R262 Difficulty in walking, not elsewhere classified: Secondary | ICD-10-CM | POA: Diagnosis not present

## 2018-09-05 DIAGNOSIS — R262 Difficulty in walking, not elsewhere classified: Secondary | ICD-10-CM | POA: Diagnosis not present

## 2018-10-01 ENCOUNTER — Telehealth: Payer: Self-pay | Admitting: Family Medicine

## 2018-10-01 NOTE — Telephone Encounter (Signed)
LVM for pt's daughter (per DPR)  regarding appointment with Dr. Creta LevinStallings on 10/09/18. [facility] will not be [facility] that day. When pt's daughter calls in, please reschedule them for an appt with Dr. Creta LevinStallings at their convenience. Thank you!

## 2018-10-06 ENCOUNTER — Other Ambulatory Visit: Payer: Self-pay

## 2018-10-06 ENCOUNTER — Encounter: Payer: Self-pay | Admitting: Physician Assistant

## 2018-10-06 ENCOUNTER — Ambulatory Visit (INDEPENDENT_AMBULATORY_CARE_PROVIDER_SITE_OTHER): Payer: Medicare Other | Admitting: Physician Assistant

## 2018-10-06 VITALS — BP 130/78 | HR 78 | Temp 97.5°F | Resp 16 | Wt 173.0 lb

## 2018-10-06 DIAGNOSIS — R319 Hematuria, unspecified: Secondary | ICD-10-CM | POA: Diagnosis not present

## 2018-10-06 DIAGNOSIS — R262 Difficulty in walking, not elsewhere classified: Secondary | ICD-10-CM | POA: Diagnosis not present

## 2018-10-06 LAB — POCT CBC
Granulocyte percent: 64.3 % (ref 37–80)
HCT, POC: 40.6 % (ref 29–41)
Hemoglobin: 13 g/dL (ref 9.5–13.5)
Lymph, poc: 1 (ref 0.6–3.4)
MCH, POC: 25.6 pg — AB (ref 27–31.2)
MCHC: 32 g/dL (ref 31.8–35.4)
MCV: 80.1 fL (ref 76–111)
MID (cbc): 0.6 (ref 0–0.9)
MPV: 8.4 fL (ref 0–99.8)
POC Granulocyte: 3 (ref 2–6.9)
POC LYMPH PERCENT: 22.7 % (ref 10–50)
POC MID %: 13 %M — AB (ref 0–12)
Platelet Count, POC: 232 10*3/uL (ref 142–424)
RBC: 5.07 M/uL (ref 4.04–5.48)
RDW, POC: 15.2 %
WBC: 4.6 10*3/uL (ref 4.6–10.2)

## 2018-10-06 NOTE — Patient Instructions (Addendum)
  Please provide "clean and dirty" urine catch and home and bring this back at your earliest convenience.   We will call you with your results.   Go to the emergency department if your symptoms worsen.   Thank you for coming in today. I hope you feel we met your needs.  Feel free to call PCP if you have any questions or further requests.  Please consider signing up for MyChart if you do not already have it, as this is a great way to communicate with me.  Best,  Whitney McVey, PA-C   IF you received an x-ray today, you will receive an invoice from [facility]. Please contact [facility] at [phone] with questions or concerns regarding your invoice.   IF you received labwork today, you will receive an invoice from [place]. Please contact [facility] at [phone] with questions or concerns regarding your invoice.   Our billing staff will not be able to assist you with questions regarding bills from these companies.  You will be contacted with the lab results as soon as they are available. The fastest way to get your results is to activate your My Chart account. Instructions are located on the last page of this paperwork. If you have not heard from [place] regarding the results in 2 weeks, please contact this office.

## 2018-10-06 NOTE — Progress Notes (Signed)
Suzanne Case  MRN: [id number] DOB: [birth year]  PCP: Dorothyann Peng, MD  Subjective:  Pt is an 84 year old female who presents to clinic for blood in urine. She is not sure how long this has been a problem.  blood in her pull-up. Not sure where blood came from.  Denies burning with urination, increased frequency, increased urgency, abdominal pain, flank pain, fever, chills, AMS, confusion.   Pt  has a past medical history of Arthritis., dementia, UTI.   Review of Systems  Constitutional: Negative for chills, fatigue and fever.  Respiratory: Negative for cough, shortness of breath and wheezing.   Cardiovascular: Negative for chest pain and palpitations.  Gastrointestinal: Negative for abdominal pain, diarrhea, nausea and vomiting.  Genitourinary: Negative for decreased urine volume, difficulty urinating, dysuria, enuresis, flank pain, frequency, hematuria and urgency.  Musculoskeletal: Negative for back pain.  Neurological: Negative for dizziness, weakness, light-headedness and headaches.    Patient Active Problem List   Diagnosis Date Noted  . Dementia (HCC) 05/18/2018  . Arthritis 05/18/2018  . Muscle weakness 05/18/2018  . UTI (lower urinary tract infection) 05/24/2016  . Fever 05/24/2016  . Tachycardia 05/24/2016    Current Outpatient Medications on File Prior to Visit  Medication Sig Dispense Refill  . aspirin EC 81 MG tablet Take 81 mg by mouth daily.    . B Complex-C (B-COMPLEX WITH VITAMIN C) tablet Take 1 tablet by mouth daily.    . Cholecalciferol (VITAMIN D PO) Take by mouth.    . Coenzyme Q10 (COQ10 PO) Take 1 capsule by mouth daily.    Marland Kitchen CRANBERRY PO Take by mouth.    . magnesium 30 MG tablet Take 30 mg by mouth 2 (two) times daily.    . Multiple Vitamin (MULTIVITAMIN WITH MINERALS) TABS tablet Take 1 tablet by mouth daily.    . naproxen sodium (ALEVE) 220 MG tablet Take 220 mg by mouth daily as needed (pain).    . TURMERIC PO Take 1 capsule by mouth  daily.    . vitamin C (ASCORBIC ACID) 500 MG tablet Take 500 mg by mouth daily.    Marland Kitchen VITAMIN E PO Take 1 tablet by mouth daily.    Marland Kitchen CINNAMON PO Take 5 mLs by mouth as directed. Daily with coffee.    . Glucosamine-Chondroitin (GLUCOSAMINE CHONDR COMPLEX PO) Take 1 tablet by mouth daily.     No current facility-administered medications on file prior to visit.     No Known Allergies   Objective:  BP 130/78 (BP Location: Right Arm, Patient Position: Sitting, Cuff Size: Large)   Pulse 78   Temp (!) 97.5 F (36.4 C) (Oral)   Resp 16   Wt 173 lb (78.5 kg) Comment: from visit x 4 months ago  SpO2 98%   BMI 33.79 kg/m   Physical Exam Results for orders placed or performed in visit on 10/06/18  POCT CBC  Result Value Ref Range   WBC 4.6 4.6 - 10.2 K/uL   Lymph, poc 1.0 0.6 - 3.4   POC LYMPH PERCENT 22.7 10 - 50 %L   MID (cbc) 0.6 0 - 0.9   POC MID % 13.0 (A) 0 - 12 %M   POC Granulocyte 3.0 2 - 6.9   Granulocyte percent 64.3 37 - 80 %G   RBC 5.07 4.04 - 5.48 M/uL   Hemoglobin 13.0 9.5 - 13.5 g/dL   HCT, POC 40.9 29 - 41 %   MCV 80.1 76 - 111 fL  MCH, POC 25.6 (A) 27 - 31.2 pg   MCHC 32.0 31.8 - 35.4 g/dL   RDW, POC 95.615.2 %   Platelet Count, POC 232 142 - 424 K/uL   MPV 8.4 0 - 99.8 fL    Assessment and Plan :  1. Hematuria, unspecified type - POCT CBC - Urinalysis, dipstick only; Future - Urinalysis, microscopic only; Future - Pt is Unable to void. Will bring back urine sample. WBC count is wnl. She is in NAD.  Marco CollieWhitney Shakeema Lippman, PA-C  Primary Care at [facility] 10/06/2018 12:26 PM  Please note: Portions of this report may have been transcribed using dragon voice recognition software. Every effort was made to ensure accuracy; however, inadvertent computerized transcription errors may be present.

## 2018-10-08 ENCOUNTER — Telehealth: Payer: Self-pay | Admitting: Family Medicine

## 2018-10-08 DIAGNOSIS — N390 Urinary tract infection, site not specified: Secondary | ICD-10-CM

## 2018-10-08 NOTE — Telephone Encounter (Unsigned)
Copied from CRM [phone]. Topic: General - Other >> Oct 08, 2018  3:49 PM Wyonia HoughJohnson, Chaz E wrote: Reason for CRM: Pts daughter Charlton Amor(Janell) called to check the status of pt Lab and urine results. Best Cb# is [phone]

## 2018-10-09 ENCOUNTER — Encounter: Payer: Medicare Other | Admitting: Family Medicine

## 2018-10-09 ENCOUNTER — Other Ambulatory Visit: Payer: Self-pay

## 2018-10-09 DIAGNOSIS — R319 Hematuria, unspecified: Secondary | ICD-10-CM

## 2018-10-09 DIAGNOSIS — N390 Urinary tract infection, site not specified: Secondary | ICD-10-CM

## 2018-10-09 LAB — POCT URINALYSIS DIP (MANUAL ENTRY)
Bilirubin, UA: NEGATIVE
Glucose, UA: NEGATIVE mg/dL
Ketones, POC UA: NEGATIVE mg/dL
Nitrite, UA: POSITIVE — AB
Protein Ur, POC: 30 mg/dL — AB
Spec Grav, UA: 1.015 (ref 1.010–1.025)
Urobilinogen, UA: 0.2 E.U./dL
pH, UA: 6.5 (ref 5.0–8.0)

## 2018-10-09 LAB — POC MICROSCOPIC URINALYSIS (UMFC): Mucus: ABSENT

## 2018-10-09 MED ORDER — CIPROFLOXACIN HCL 250 MG PO TABS: 250.0000 mg | ORAL_TABLET | Freq: Two times a day (BID) | ORAL | 0 refills | Status: AC

## 2018-10-09 NOTE — Telephone Encounter (Signed)
Patient's  daughter Suzanne AmorJanell advised regarding medication sent to pharmacy, also advise of urine is being cultured.

## 2018-10-09 NOTE — Telephone Encounter (Signed)
Please let the patient know that there is a urinary tract infection. I have sent in Cipro 250mg  twice a day for 3 days. We sent a urine culture to check the bacteria and to make sure we are getting the right medication.

## 2018-10-09 NOTE — Telephone Encounter (Signed)
Urine was received today and in-house results will be released today to pt daughter. The culture will be sent out and released to daughter in 4 to 5 days. She verbalized understanding.

## 2018-10-09 NOTE — Telephone Encounter (Signed)
After speaking to pt daughter this morning,  I  was informed by the lab that her urine was invalid due to sitting overnight.  Called the pt daughter back and informed her that we would need another sample to run test, she verbalized understanding and states she will bring in another sample. Orders has been placed for in-house.

## 2018-10-09 NOTE — Telephone Encounter (Signed)
Spoke with patient daughter today and advised her that since she dropped the urine off yesterday it will take 4 to 5 days for the results to come back. Informed her that when the results return we will give her a call. She verbalized understanding.

## 2018-10-10 ENCOUNTER — Encounter: Payer: Self-pay | Admitting: Physician Assistant

## 2018-10-12 LAB — URINE CULTURE

## 2018-10-15 ENCOUNTER — Encounter: Payer: Self-pay | Admitting: Physician Assistant

## 2018-10-30 ENCOUNTER — Ambulatory Visit: Payer: Medicare Other | Admitting: Family Medicine

## 2018-11-05 DIAGNOSIS — R262 Difficulty in walking, not elsewhere classified: Secondary | ICD-10-CM | POA: Diagnosis not present

## 2018-12-06 DIAGNOSIS — R262 Difficulty in walking, not elsewhere classified: Secondary | ICD-10-CM | POA: Diagnosis not present

## 2019-01-08 ENCOUNTER — Ambulatory Visit (INDEPENDENT_AMBULATORY_CARE_PROVIDER_SITE_OTHER): Payer: Medicare Other | Admitting: Family Medicine

## 2019-01-08 ENCOUNTER — Encounter: Payer: Self-pay | Admitting: Family Medicine

## 2019-01-08 ENCOUNTER — Other Ambulatory Visit: Payer: Self-pay

## 2019-01-08 ENCOUNTER — Telehealth: Payer: Self-pay | Admitting: *Deleted

## 2019-01-08 VITALS — BP 146/79 | HR 91 | Temp 98.3°F | Resp 18 | Ht 60.0 in | Wt 182.0 lb

## 2019-01-08 DIAGNOSIS — F0390 Unspecified dementia without behavioral disturbance: Secondary | ICD-10-CM

## 2019-01-08 DIAGNOSIS — N390 Urinary tract infection, site not specified: Secondary | ICD-10-CM | POA: Diagnosis not present

## 2019-01-08 DIAGNOSIS — Z0001 Encounter for general adult medical examination with abnormal findings: Secondary | ICD-10-CM | POA: Diagnosis not present

## 2019-01-08 DIAGNOSIS — Z8639 Personal history of other endocrine, nutritional and metabolic disease: Secondary | ICD-10-CM

## 2019-01-08 DIAGNOSIS — Z0000 Encounter for general adult medical examination without abnormal findings: Secondary | ICD-10-CM

## 2019-01-08 DIAGNOSIS — E7800 Pure hypercholesterolemia, unspecified: Secondary | ICD-10-CM

## 2019-01-08 LAB — POCT URINALYSIS DIP (MANUAL ENTRY)
Bilirubin, UA: NEGATIVE
Glucose, UA: NEGATIVE mg/dL
Ketones, POC UA: NEGATIVE mg/dL
Nitrite, UA: POSITIVE — AB
Protein Ur, POC: 100 mg/dL — AB
Spec Grav, UA: 1.015 (ref 1.010–1.025)
Urobilinogen, UA: 0.2 E.U./dL
pH, UA: 7 (ref 5.0–8.0)

## 2019-01-08 MED ORDER — NITROFURANTOIN MONOHYD MACRO 100 MG PO CAPS: 100.0000 mg | ORAL_CAPSULE | Freq: Two times a day (BID) | ORAL | 0 refills | Status: AC

## 2019-01-08 NOTE — Patient Instructions (Addendum)
If you have lab work done today you will be contacted with your lab results within the next 2 weeks.  If you have not heard from [place] then please contact us. The fastest way to get your results is to register for My Chart.   IF you received an x-ray today, you will receive an invoice from [facility]. Please contact [facility] at [phone] with questions or concerns regarding your invoice.   IF you received labwork today, you will receive an invoice from [place]. Please contact [facility] at [phone] with questions or concerns regarding your invoice.   Our billing staff will not be able to assist you with questions regarding bills from these companies.  You will be contacted with the lab results as soon as they are available. The fastest way to get your results is to activate your My Chart account. Instructions are located on the last page of this paperwork. If you have not heard from [place] regarding the results in 2 weeks, please contact this office.     Advance Directive  Advance directives are legal documents that let you make choices ahead of time about your health care and medical treatment in case you become unable to communicate for yourself. Advance directives are a way for you to communicate your wishes to family, friends, and health care providers. This can help convey your decisions about end-of-life care if you become unable to communicate. Discussing and writing advance directives should happen over time rather than all at once. Advance directives can be changed depending on your situation and what you want, even after you have signed the advance directives. If you do not have an advance directive, some states assign family decision makers to act on your behalf based on how closely you are related to them. Each state has its own laws regarding advance directives. You may want to check with your health care provider, attorney, or state representative about the  laws in your state. There are different types of advance directives, such as:  Medical power of attorney.  Living will.  Do not resuscitate (DNR) or do not attempt resuscitation (DNAR) order. Health care proxy and medical power of attorney A health care proxy, also called a health care agent, is a person who is appointed to make medical decisions for you in cases in which you are unable to make the decisions yourself. Generally, people choose someone they know well and trust to represent their preferences. Make sure to ask this person for an agreement to act as your proxy. A proxy may have to exercise judgment in the event of a medical decision for which your wishes are not known. A medical power of attorney is a legal document that names your health care proxy. Depending on the laws in your state, after the document is written, it may also need to be:  Signed.  Notarized.  Dated.  Copied.  Witnessed.  Incorporated into your medical record. You may also want to appoint someone to manage your financial affairs in a situation in which you are unable to do so. This is called a durable power of attorney for finances. It is a separate legal document from the durable power of attorney for health care. You may choose the same person or someone different from your health care proxy to act as your agent in financial matters. If you do not appoint a proxy, or if there is a concern that the proxy is not acting in your best  interests, a court-appointed guardian may be designated to act on your behalf. Living will A living will is a set of instructions documenting your wishes about medical care when you cannot express them yourself. Health care providers should keep a copy of your living will in your medical record. You may want to give a copy to family members or friends. To alert caregivers in case of an emergency, you can place a card in your wallet to let them know that you have a living will and  where they can find it. A living will is used if you become:  Terminally ill.  Incapacitated.  Unable to communicate or make decisions. Items to consider in your living will include:  The use or non-use of life-sustaining equipment, such as dialysis machines and breathing machines (ventilators).  A DNR or DNAR order, which is the instruction not to use cardiopulmonary resuscitation (CPR) if breathing or heartbeat stops.  The use or non-use of tube feeding.  Withholding of food and fluids.  Comfort (palliative) care when the goal becomes comfort rather than a cure.  Organ and tissue donation. A living will does not give instructions for distributing your money and property if you should pass away. It is recommended that you seek the advice of a lawyer when writing a will. Decisions about taxes, beneficiaries, and asset distribution will be legally binding. This process can relieve your family and friends of any concerns surrounding disputes or questions that may come up about the distribution of your assets. DNR or DNAR A DNR or DNAR order is a request not to have CPR in the event that your heart stops beating or you stop breathing. If a DNR or DNAR order has not been made and shared, a health care provider will try to help any patient whose heart has stopped or who has stopped breathing. If you plan to have surgery, talk with your health care provider about how your DNR or DNAR order will be followed if problems occur. Summary  Advance directives are the legal documents that allow you to make choices ahead of time about your health care and medical treatment in case you become unable to communicate for yourself.  The process of discussing and writing advance directives should happen over time. You can change the advance directives, even after you have signed them.  Advance directives include DNR or DNAR orders, living wills, and designating an agent as your medical power of  attorney. This information is not intended to replace advice given to you by your health care provider. Make sure you discuss any questions you have with your health care provider. Document Released: 01/31/2008 Document Revised: 09/12/2016 Document Reviewed: 09/12/2016 Elsevier Interactive Patient Education  2019 [employer].

## 2019-01-08 NOTE — Progress Notes (Signed)
QUICK REFERENCE INFORMATION: The ABCs of Providing the Annual Wellness Visit  CMS.gov Medicare Learning Network  [employer] Annual Wellness Visit  Subjective:   Suzanne Case is a 85 y.o. Female who presents for an Annual Wellness Visit.   Problems addressed today  Recurrent UTI Pt continues to have recurrent urinary tract infection She hasa home health aide to help with hygiene and bladder care She is doing cranberry, probiotic, vitamin C and vitamin D on a regular basis She has not had any issues with weakness, fatigue, worsening dementia, fevers or chills Her daughter would like her urine checked today due to strong urine odor   Lower Extremity Edema She sits in a chair most of the day She does not wear compression stockings  She has cut back on her salt at home She sits and watches tv She walks with a walker at home  Patient Active Problem List   Diagnosis Date Noted  . Dementia (HCC) 05/18/2018  . Arthritis 05/18/2018  . Muscle weakness 05/18/2018  . UTI (lower urinary tract infection) 05/24/2016  . Fever 05/24/2016  . Tachycardia 05/24/2016    Past Medical History:  Diagnosis Date  . Arthritis      Past Surgical History:  Procedure Laterality Date  . CESAREAN SECTION    . CESAREAN SECTION       Outpatient Medications Prior to Visit  Medication Sig Dispense Refill  . aspirin EC 81 MG tablet Take 81 mg by mouth daily.    . B Complex-C (B-COMPLEX WITH VITAMIN C) tablet Take 1 tablet by mouth daily.    . Cholecalciferol (VITAMIN D PO) Take by mouth.    Marland Kitchen CINNAMON PO Take 5 mLs by mouth as directed. Daily with coffee.    . Coenzyme Q10 (COQ10 PO) Take 1 capsule by mouth daily.    Marland Kitchen CRANBERRY PO Take by mouth.    . Glucosamine-Chondroitin (GLUCOSAMINE CHONDR COMPLEX PO) Take 1 tablet by mouth daily.    . magnesium 30 MG tablet Take 30 mg by mouth 2 (two) times daily.    . Multiple Vitamin (MULTIVITAMIN WITH MINERALS) TABS tablet Take 1 tablet by mouth  daily.    . naproxen sodium (ALEVE) 220 MG tablet Take 220 mg by mouth daily as needed (pain).    . TURMERIC PO Take 1 capsule by mouth daily.    . vitamin C (ASCORBIC ACID) 500 MG tablet Take 500 mg by mouth daily.    Marland Kitchen VITAMIN E PO Take 1 tablet by mouth daily.     No facility-administered medications prior to visit.     No Known Allergies   Family History  Problem Relation Age of Onset  . Diabetes Mother   . Heart disease Mother   . Hypertension Mother   . Diabetes Father   . Heart disease Father   . Hypertension Father   . Diabetes Sister   . Heart disease Sister   . Hypertension Sister      Social History   Socioeconomic History  . Marital status: Widowed    Spouse name: Not on file  . Number of children: Not on file  . Years of education: Not on file  . Highest education level: Not on file  Occupational History  . Not on file  Social Needs  . Financial resource strain: Not on file  . Food insecurity:    Worry: Not on file    Inability: Not on file  . Transportation needs:    Medical: Not  on file    Non-medical: Not on file  Tobacco Use  . Smoking status: Never Smoker  . Smokeless tobacco: Never Used  Substance and Sexual Activity  . Alcohol use: No    Alcohol/week: 0.0 standard drinks  . Drug use: No  . Sexual activity: Not on file  Lifestyle  . Physical activity:    Days per week: Not on file    Minutes per session: Not on file  . Stress: Not on file  Relationships  . Social connections:    Talks on phone: Not on file    Gets together: Not on file    Attends religious service: Not on file    Active member of club or organization: Not on file    Attends meetings of clubs or organizations: Not on file    Relationship status: Not on file  Other Topics Concern  . Not on file  Social History Narrative   ** Merged History Encounter **          Recent Hospitalizations? No  Health Habits: Current exercise activities include: none Exercise:  0 times/week. Diet: in general, a "healthy" diet    Alcohol intake:   Health Risk Assessment: The patient has completed a Health Risk Assessment. This has been reveiwed with them and has been scanned into the [facility] system as an attached document.  Current Medical Providers and Suppliers: [facility] Patient Care Team: Doristine Bosworth, MD as PCP - General (Internal Medicine) No future appointments.   Age-appropriate Screening Schedule: The list below includes current immunization status and future screening recommendations based on patient's age. Orders for these recommended tests are listed in the plan section. The patient has been provided with a written plan. Immunization History  Administered Date(s) Administered  . Pneumococcal Polysaccharide-23 05/25/2016    Health Maintenance reviewed -  Pneumonia up to date   Depression Screen-PHQ2/9 completed today  Depression screen [facility] 2/9 01/08/2019 10/06/2018 06/07/2018 05/18/2018 07/02/2016  Decreased Interest 0 0 0 0 0  Down, Depressed, Hopeless 0 0 0 0 0  PHQ - 2 Score 0 0 0 0 0       Depression Severity and Treatment Recommendations:  0-4= None  5-9= Mild / Treatment: Support, educate to call if worse; return in one month  10-14= Moderate / Treatment: Support, watchful waiting; Antidepressant or Psycotherapy  15-19= Moderately severe / Treatment: Antidepressant OR Psychotherapy  >= 20 = Major depression, severe / Antidepressant AND Psychotherapy  Functional Status Survey:   Is the patient deaf or have difficulty hearing?: No Does the patient have difficulty seeing, even when wearing glasses/contacts?: No Does the patient have difficulty concentrating, remembering, or making decisions?: Yes Does the patient have difficulty walking or climbing stairs?: Yes Does the patient have difficulty dressing or bathing?: Yes Does the patient have difficulty doing errands alone such as visiting a doctor's office or shopping?: Yes    Advanced  Care Planning: 1. Patient has executed an Advance Directive: No 2. If no, patient was given the opportunity to execute an Advance Directive today? No 3. Are the patient's advanced directives in [place]? No 4. This patient has the ability to prepare an Advance Directive: No 5. Provider is willing to follow the patient's wishes: No  Cognitive Assessment: Does the patient have evidence of cognitive impairment? Yes,  Dementia The patient does not have any evidence of any cognitive problems and denies any  change in mood/affect, appearance, speech, memory or motor skills.  Identification of Risk Factors: Risk  factors include: none  ROS Review of Systems  Constitutional: Negative for activity change, appetite change, chills and fever.  HENT: Negative for congestion, nosebleeds, trouble swallowing and voice change.   Respiratory: Negative for cough, shortness of breath and wheezing.   Gastrointestinal: Negative for diarrhea, nausea and vomiting.  Genitourinary: Negative for difficulty urinating, dysuria, flank pain and hematuria.  See hpi Musculoskeletal: bilateral knee arthritis Neurological: Negative for dizziness, speech difficulty, light-headedness and numbness.  See HPI. All other review of systems negative.   Objective:   Vitals:   01/08/19 1123  BP: (!) 146/79  Pulse: 91  Resp: 18  Temp: 98.3 F (36.8 C)  TempSrc: Oral  SpO2: 100%  Weight: 182 lb (82.6 kg)  Height: 5' (1.524 m)    Body mass index is 35.54 kg/m.  Physical Exam  Constitutional: Oriented to person, place, and time. Appears well-developed and well-nourished.  HENT:  Head: Normocephalic and atraumatic.  Eyes: Conjunctivae and EOM are normal.  Cardiovascular: Normal rate, regular rhythm, normal heart sounds and intact distal pulses.  No murmur heard. Pulmonary/Chest: Effort normal and breath sounds normal. No stridor. No respiratory distress. Has no wheezes.  Neurological: Is alert and oriented to person,  place, and time.  Skin: Skin is warm. Capillary refill takes less than 2 seconds.  Psychiatric: Has a normal mood and affect. Behavior is normal. Judgment and thought content normal.      Assessment/Plan:   Patient Self-Management and Personalized Health Advice The patient has been provided with information about:  return for routine annual checkups  During the course of the visit the patient was educated and counseled about appropriate screening and preventive services including:   return annually or prn     Body mass index is 35.54 kg/m. Discussed the patient's BMI with her. The BMI BMI is not in the acceptable range; no BMI management plan is appropriate.  Tyrice Medendorp was seen today for annual exam.  Diagnoses and all orders for this visit:  Encounter for Medicare annual wellness exam  Recurrent UTI-  Will treat empirically while awaiting culture for final sensitivity -     POCT urinalysis dipstick -     Urine Culture  Dementia without behavioral disturbance, unspecified dementia type (HCC)-  Advised pt caregiver to get documentation for power of attorney -     Basic metabolic panel -     Lipid panel  History of high cholesterol -     Basic metabolic panel -     Lipid panel  Pure hypercholesterolemia -     Basic metabolic panel -     Lipid panel  Other orders -     nitrofurantoin, macrocrystal-monohydrate, (MACROBID) 100 MG capsule; Take 1 capsule (100 mg total) by mouth 2 (two) times daily for 5 days.      Return if symptoms worsen or fail to improve.  No future appointments.  Patient Instructions       If you have lab work done today you will be contacted with your lab results within the next 2 weeks.  If you have not heard from [place] then please contact us. The fastest way to get your results is to register for My Chart.   IF you received an x-ray today, you will receive an invoice from [facility]. Please contact [facility] at  [phone] with questions or concerns regarding your invoice.   IF you received labwork today, you will receive an invoice from [facility]. Please contact [facility] at [phone] with questions  or concerns regarding your invoice.   Our billing staff will not be able to assist you with questions regarding bills from these companies.  You will be contacted with the lab results as soon as they are available. The fastest way to get your results is to activate your My Chart account. Instructions are located on the last page of this paperwork. If you have not heard from [place] regarding the results in 2 weeks, please contact this office.     Advance Directive  Advance directives are legal documents that let you make choices ahead of time about your health care and medical treatment in case you become unable to communicate for yourself. Advance directives are a way for you to communicate your wishes to family, friends, and health care providers. This can help convey your decisions about end-of-life care if you become unable to communicate. Discussing and writing advance directives should happen over time rather than all at once. Advance directives can be changed depending on your situation and what you want, even after you have signed the advance directives. If you do not have an advance directive, some states assign family decision makers to act on your behalf based on how closely you are related to them. Each state has its own laws regarding advance directives. You may want to check with your health care provider, attorney, or state representative about the laws in your state. There are different types of advance directives, such as:  Medical power of attorney.  Living will.  Do not resuscitate (DNR) or do not attempt resuscitation (DNAR) order. Health care proxy and medical power of attorney A health care proxy, also called a health care agent, is a person who is appointed to make medical decisions  for you in cases in which you are unable to make the decisions yourself. Generally, people choose someone they know well and trust to represent their preferences. Make sure to ask this person for an agreement to act as your proxy. A proxy may have to exercise judgment in the event of a medical decision for which your wishes are not known. A medical power of attorney is a legal document that names your health care proxy. Depending on the laws in your state, after the document is written, it may also need to be:  Signed.  Notarized.  Dated.  Copied.  Witnessed.  Incorporated into your medical record. You may also want to appoint someone to manage your financial affairs in a situation in which you are unable to do so. This is called a durable power of attorney for finances. It is a separate legal document from the durable power of attorney for health care. You may choose the same person or someone different from your health care proxy to act as your agent in financial matters. If you do not appoint a proxy, or if there is a concern that the proxy is not acting in your best interests, a court-appointed guardian may be designated to act on your behalf. Living will A living will is a set of instructions documenting your wishes about medical care when you cannot express them yourself. Health care providers should keep a copy of your living will in your medical record. You may want to give a copy to family members or friends. To alert caregivers in case of an emergency, you can place a card in your wallet to let them know that you have a living will and where they can find it. A living will is used  if you become:  Terminally ill.  Incapacitated.  Unable to communicate or make decisions. Items to consider in your living will include:  The use or non-use of life-sustaining equipment, such as dialysis machines and breathing machines (ventilators).  A DNR or DNAR order, which is the instruction not  to use cardiopulmonary resuscitation (CPR) if breathing or heartbeat stops.  The use or non-use of tube feeding.  Withholding of food and fluids.  Comfort (palliative) care when the goal becomes comfort rather than a cure.  Organ and tissue donation. A living will does not give instructions for distributing your money and property if you should pass away. It is recommended that you seek the advice of a lawyer when writing a will. Decisions about taxes, beneficiaries, and asset distribution will be legally binding. This process can relieve your family and friends of any concerns surrounding disputes or questions that may come up about the distribution of your assets. DNR or DNAR A DNR or DNAR order is a request not to have CPR in the event that your heart stops beating or you stop breathing. If a DNR or DNAR order has not been made and shared, a health care provider will try to help any patient whose heart has stopped or who has stopped breathing. If you plan to have surgery, talk with your health care provider about how your DNR or DNAR order will be followed if problems occur. Summary  Advance directives are the legal documents that allow you to make choices ahead of time about your health care and medical treatment in case you become unable to communicate for yourself.  The process of discussing and writing advance directives should happen over time. You can change the advance directives, even after you have signed them.  Advance directives include DNR or DNAR orders, living wills, and designating an agent as your medical power of attorney. This information is not intended to replace advice given to you by your health care provider. Make sure you discuss any questions you have with your health care provider. Document Released: 01/31/2008 Document Revised: 09/12/2016 Document Reviewed: 09/12/2016 Elsevier Interactive Patient Education  [employer].    An after visit summary with all  of these plans was given to the patient.

## 2019-01-09 LAB — BASIC METABOLIC PANEL
BUN/Creatinine Ratio: 33 — ABNORMAL HIGH (ref 12–28)
BUN: 16 mg/dL (ref 10–36)
CO2: 24 mmol/L (ref 20–29)
Calcium: 9.4 mg/dL (ref 8.7–10.3)
Chloride: 101 mmol/L (ref 96–106)
Creatinine, Ser: 0.48 mg/dL — ABNORMAL LOW (ref 0.57–1.00)
GFR calc Af Amer: 100 mL/min/{1.73_m2} (ref >59)
GFR calc non Af Amer: 87 mL/min/{1.73_m2} (ref >59)
Glucose: 80 mg/dL (ref 65–99)
Potassium: 4.2 mmol/L (ref 3.5–5.2)
Sodium: 139 mmol/L (ref 134–144)

## 2019-01-09 LAB — LIPID PANEL
CHOLESTEROL TOTAL: 227 mg/dL — AB (ref 100–199)
Chol/HDL Ratio: 5.2 ratio — ABNORMAL HIGH (ref 0.0–4.4)
HDL: 44 mg/dL (ref >39)
LDL Calculated: 155 mg/dL — ABNORMAL HIGH (ref 0–99)
Triglycerides: 141 mg/dL (ref 0–149)
VLDL Cholesterol Cal: 28 mg/dL (ref 5–40)

## 2019-01-09 NOTE — Progress Notes (Signed)
Spoke with pt daughter about labs and medication sent to the pharmacy. She verbalized understanding.

## 2019-01-09 NOTE — Telephone Encounter (Signed)
Copied from CRM [id number]. Topic: Referral - Request for Referral >> Jan 09, 2019  9:55 AM Wyonia Hough E wrote: Has patient seen PCP for this complaint? No  *If NO, is insurance requiring patient see PCP for this issue before PCP can refer them? Referral for which specialty: podiatrist  Preferred provider/office:  Reason for referral: skin growth that causes nail infection (prefers someone to come to home but ok if that is not an option)

## 2019-01-09 NOTE — Telephone Encounter (Signed)
Please advise 

## 2019-01-10 LAB — URINE CULTURE

## 2019-01-25 NOTE — Telephone Encounter (Signed)
Will address after the pandemic

## 2019-01-31 ENCOUNTER — Encounter: Payer: Self-pay | Admitting: Family Medicine

## 2019-02-18 ENCOUNTER — Encounter: Payer: Self-pay | Admitting: Family Medicine

## 2019-03-06 ENCOUNTER — Telehealth: Payer: Self-pay | Admitting: Family Medicine

## 2019-03-06 NOTE — Telephone Encounter (Signed)
Copied from CRM [id number]. Topic: General - Other >> Mar 06, 2019 10:26 AM Darron Doom wrote: Reason for CRM: [facility] called to say that they have been faxing refill orders for diclofenac/lidocane/baclofen/menthol cream she is requesting a reply from Dr Creta Levin in regards to this Rx. Please advise Ph# [phone]

## 2019-03-07 NOTE — Telephone Encounter (Signed)
Prescription given to Dr. Creta Levin

## 2019-05-08 ENCOUNTER — Encounter: Payer: Self-pay | Admitting: Family Medicine

## 2019-05-09 ENCOUNTER — Other Ambulatory Visit: Payer: Self-pay

## 2019-05-09 ENCOUNTER — Telehealth (INDEPENDENT_AMBULATORY_CARE_PROVIDER_SITE_OTHER): Payer: Medicare Other | Admitting: Family Medicine

## 2019-05-09 DIAGNOSIS — N390 Urinary tract infection, site not specified: Secondary | ICD-10-CM | POA: Diagnosis not present

## 2019-05-09 MED ORDER — CIPROFLOXACIN HCL 250 MG PO TABS: 250.0000 mg | ORAL_TABLET | Freq: Two times a day (BID) | ORAL | 0 refills | Status: DC

## 2019-05-09 NOTE — Telephone Encounter (Signed)
Evaluated by Dr. Nolon Rod today.

## 2019-05-09 NOTE — Progress Notes (Signed)
Telemedicine Encounter- SOAP NOTE Established Patient  This telephone encounter was conducted with the patient's (or proxy's) verbal consent via audio telecommunications: yes/no: Yes Patient was instructed to have this encounter in a suitably private space; and to only have persons present to whom they give permission to participate. In addition, patient identity was confirmed by use of name plus two identifiers (DOB and address).  I discussed the limitations, risks, security and privacy concerns of performing an evaluation and management service by telephone and the availability of in person appointments. I also discussed with the patient that there may be a patient responsible charge related to this service. The patient expressed understanding and agreed to proceed.  I spent a total of TIME; 0 MIN TO 60 MIN: 15 minutes talking with the patient or their proxy.  CC: recurrent UTI  Subjective   Suzanne Case is a 85 y.o. established patient. Telephone visit today for  HPI   She gets urinary tract infections She has symptoms of acute uti She has some foul urine odor She has chronic incontinence of urine She has no fevers No other changes She gets them frequently Last  UTI was 01/08/2019 She does not drink enough water per her daughter She has a bedside commode that she also uses The urine is cloudy She has no hematuria No frequency Patient has dementia and is cared for by her daughter   Patient Active Problem List   Diagnosis Date Noted  . Dementia ([place]) 05/18/2018  . Arthritis 05/18/2018  . Muscle weakness 05/18/2018  . UTI (lower urinary tract infection) 05/24/2016  . Fever 05/24/2016  . Tachycardia 05/24/2016    Past Medical History:  Diagnosis Date  . Arthritis     Current Outpatient Medications  Medication Sig Dispense Refill  . aspirin EC 81 MG tablet Take 81 mg by mouth daily.    . B Complex-C (B-COMPLEX WITH VITAMIN C) tablet Take 1 tablet by mouth  daily.    . Cholecalciferol (VITAMIN D PO) Take by mouth.    Marland Kitchen CINNAMON PO Take 5 mLs by mouth as directed. Daily with coffee.    . ciprofloxacin (CIPRO) 250 MG tablet Take 1 tablet (250 mg total) by mouth 2 (two) times daily. 6 tablet 0  . Coenzyme Q10 (COQ10 PO) Take 1 capsule by mouth daily.    Marland Kitchen CRANBERRY PO Take by mouth.    . Glucosamine-Chondroitin (GLUCOSAMINE CHONDR COMPLEX PO) Take 1 tablet by mouth daily.    . magnesium 30 MG tablet Take 30 mg by mouth 2 (two) times daily.    . Multiple Vitamin (MULTIVITAMIN WITH MINERALS) TABS tablet Take 1 tablet by mouth daily.    . naproxen sodium (ALEVE) 220 MG tablet Take 220 mg by mouth daily as needed (pain).    . TURMERIC PO Take 1 capsule by mouth daily.    . vitamin C (ASCORBIC ACID) 500 MG tablet Take 500 mg by mouth daily.    Marland Kitchen VITAMIN E PO Take 1 tablet by mouth daily.     No current facility-administered medications for this visit.     No Known Allergies  Social History   Socioeconomic History  . Marital status: Widowed    Spouse name: Not on file  . Number of children: Not on file  . Years of education: Not on file  . Highest education level: Not on file  Occupational History  . Not on file  Social Needs  . Financial resource strain: Not on  file  . Food insecurity    Worry: Not on file    Inability: Not on file  . Transportation needs    Medical: Not on file    Non-medical: Not on file  Tobacco Use  . Smoking status: Never Smoker  . Smokeless tobacco: Never Used  Substance and Sexual Activity  . Alcohol use: No    Alcohol/week: 0.0 standard drinks  . Drug use: No  . Sexual activity: Not on file  Lifestyle  . Physical activity    Days per week: Not on file    Minutes per session: Not on file  . Stress: Not on file  Relationships  . Social Musicianconnections    Talks on phone: Not on file    Gets together: Not on file    Attends religious service: Not on file    Active member of club or organization: Not on  file    Attends meetings of clubs or organizations: Not on file    Relationship status: Not on file  . Intimate partner violence    Fear of current or ex partner: Not on file    Emotionally abused: Not on file    Physically abused: Not on file    Forced sexual activity: Not on file  Other Topics Concern  . Not on file  Social History Narrative   ** Merged History Encounter **        ROS  Objective  Unable to perform assessment  Vitals as reported by the patient: There were no vitals filed for this visit.  Diagnoses and all orders for this visit:  Recurrent UTI -     ciprofloxacin (CIPRO) 250 MG tablet; Take 1 tablet (250 mg total) by mouth 2 (two) times daily.   Will treat empirically with Cipro based on last urine culture March 2020 Advised to increase hydration  Call if pt develops fevers, lethargy, or urine output   I discussed the assessment and treatment plan with the patient. The patient was provided an opportunity to ask questions and all were answered. The patient agreed with the plan and demonstrated an understanding of the instructions.   The patient was advised to call back or seek an in-person evaluation if the symptoms worsen or if the condition fails to improve as anticipated.  I provided 15 minutes of non-face-to-face time during this encounter.  Doristine BosworthZoe A Azam Gervasi, MD  Primary Care at [facility]

## 2019-08-27 ENCOUNTER — Encounter: Payer: Self-pay | Admitting: Family Medicine

## 2019-09-02 IMAGING — CR DG KNEE COMPLETE 4+V*R*
4 series · 4 of 4 positions shown · non-contrast
Comparison: None.

CLINICAL DATA: Chronic bilateral knee pain.  No trauma.

EXAM:
RIGHT KNEE - COMPLETE 4+ VIEW; LEFT KNEE - COMPLETE 4+ VIEW

[x knee ap right (1 of 3)]
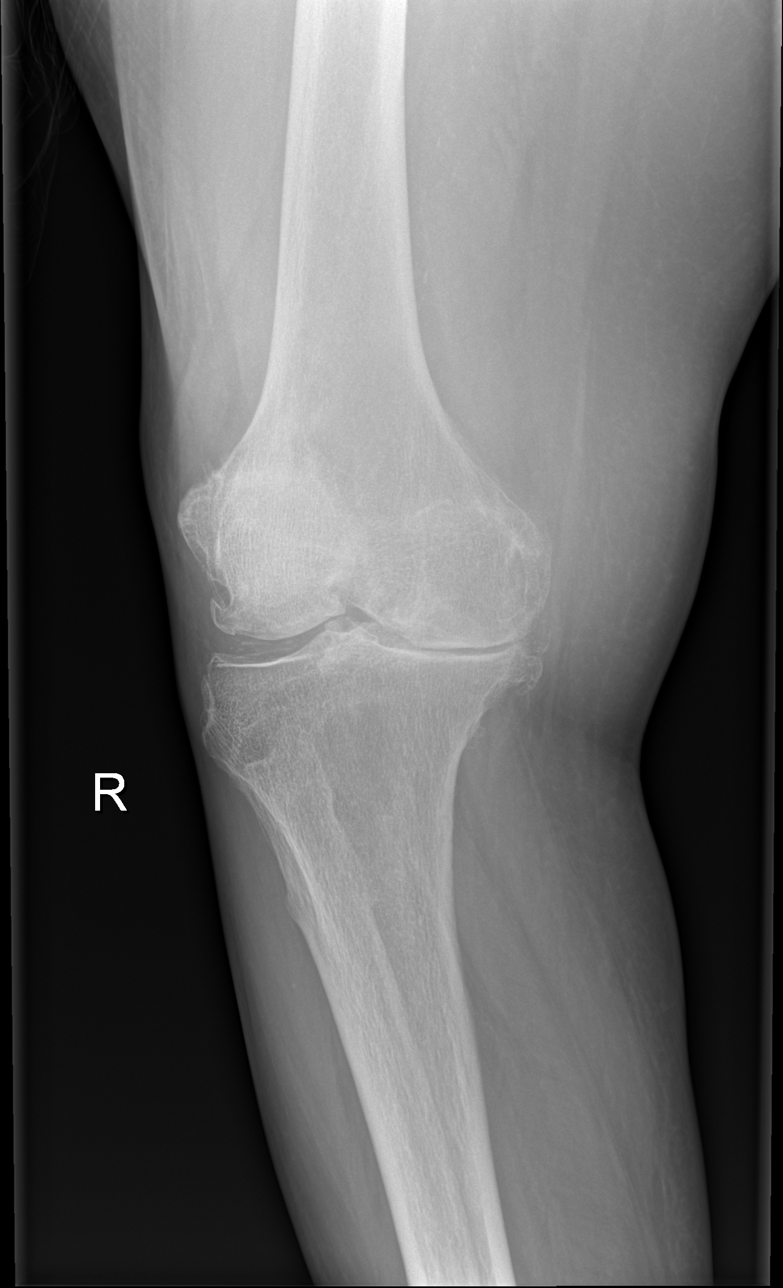

[x knee ap right (2 of 3)]
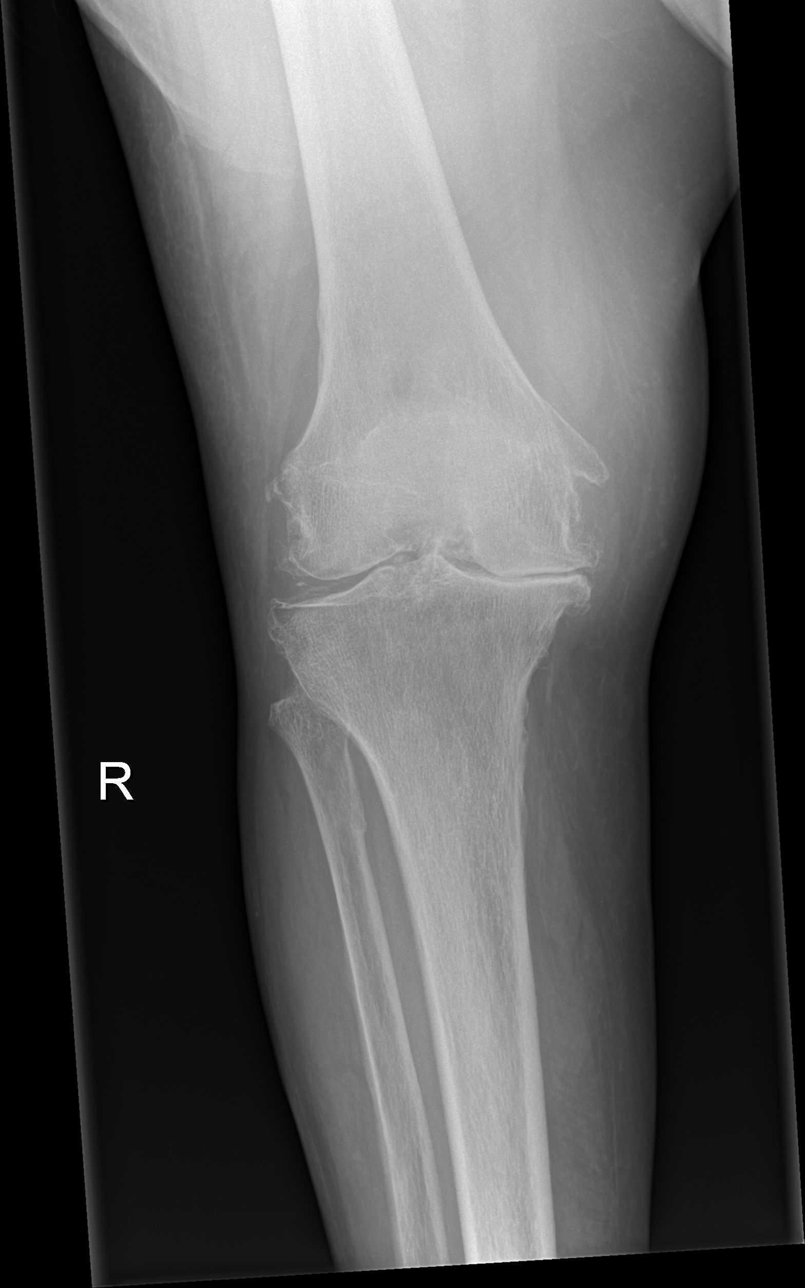

[x knee ap right (3 of 3)]
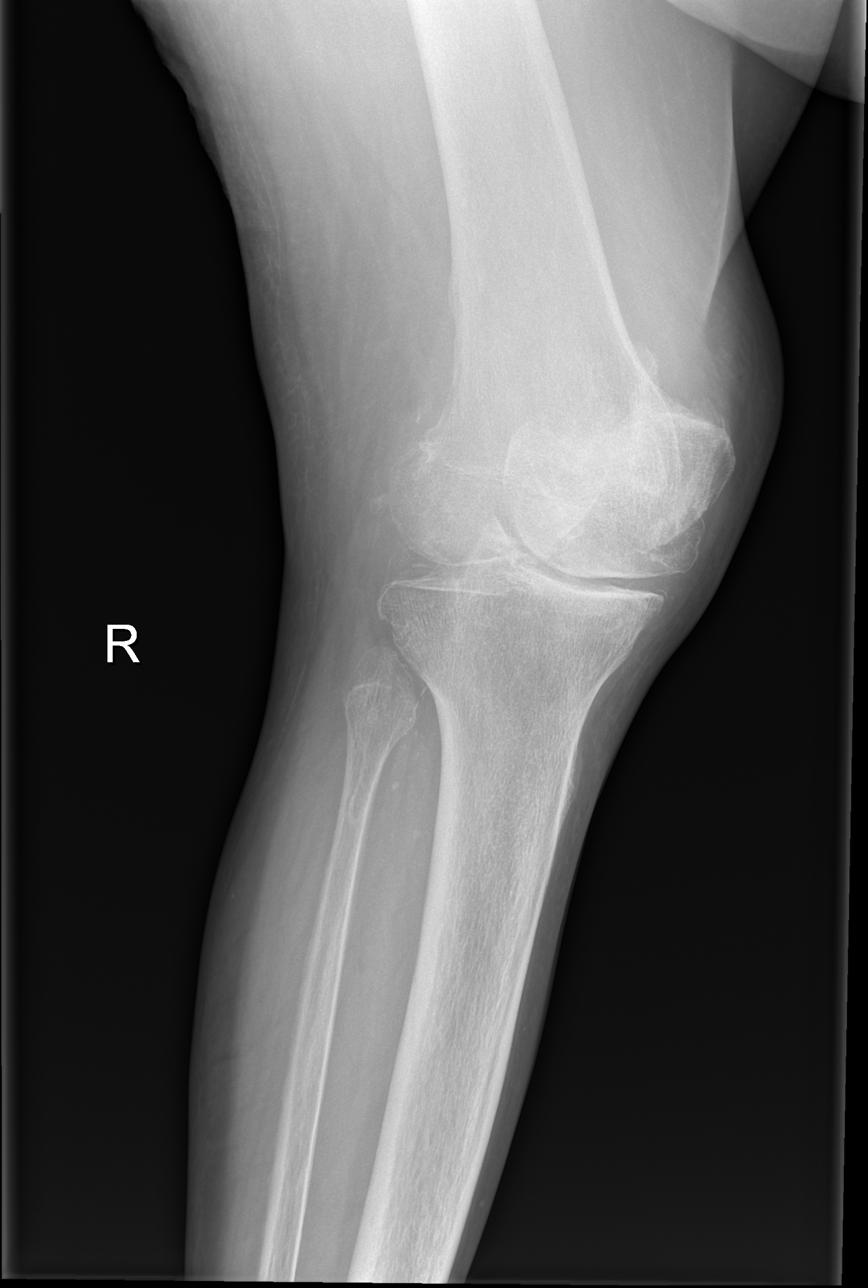

[x knee lat right]
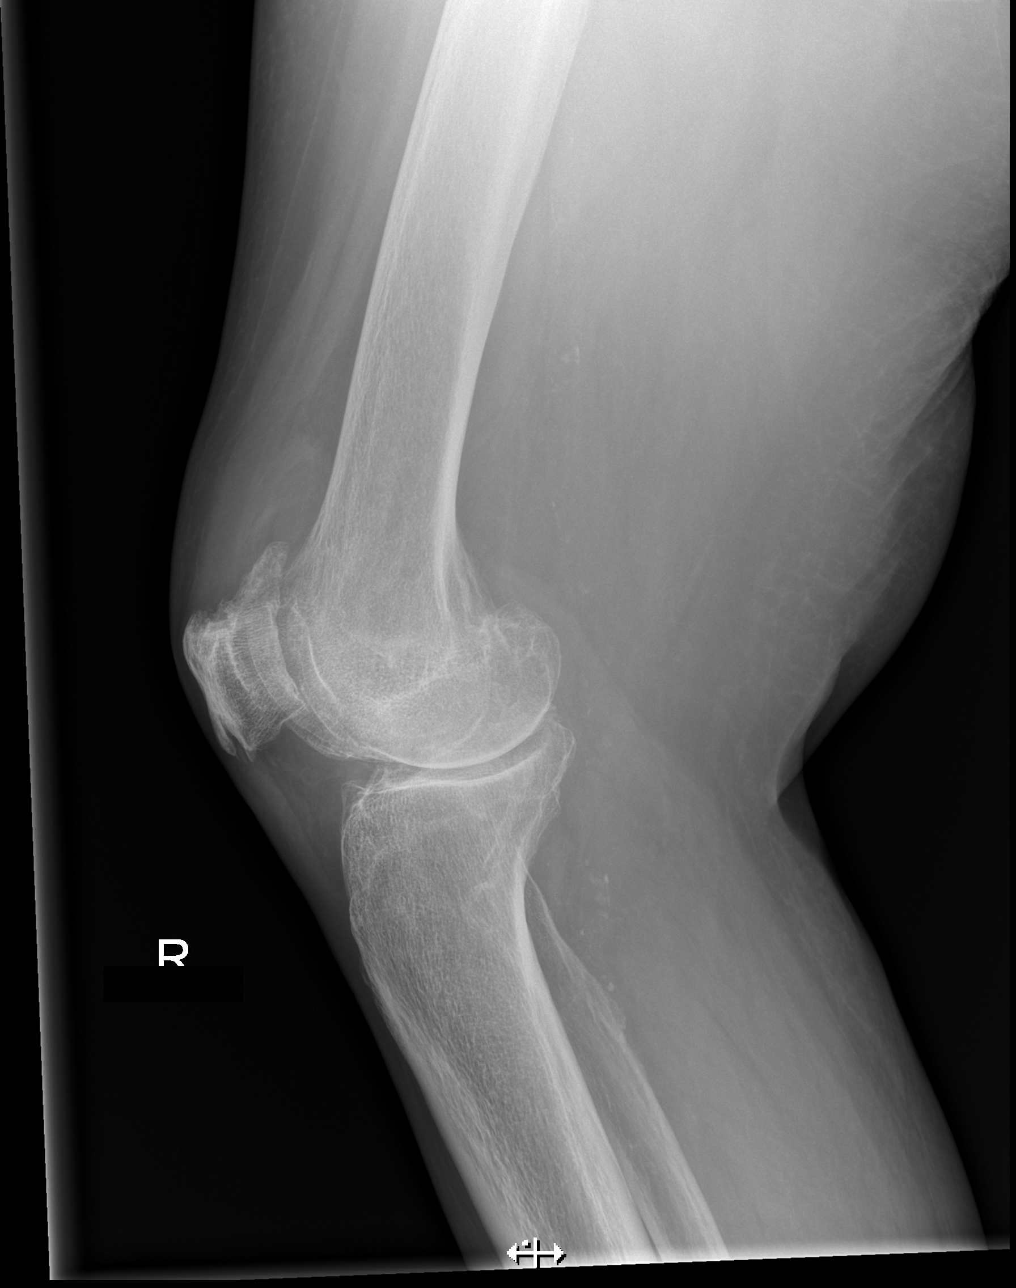

[4 of 4 positions shown; findings below may reference images not displayed]

FINDINGS: Left knee: No acute fracture or dislocation. Severe medial and
moderate lateral and patellofemoral compartment joint space
narrowing with subchondral sclerosis and osteophyte formation. Small
suprapatellar joint effusion. Chondrocalcinosis of the lateral
meniscus. Osteopenia. Vascular calcifications.

Right knee: No acute fracture or dislocation. Severe medial and
patellofemoral and moderate lateral compartment joint space
narrowing with subchondral sclerosis and osteophyte formation. Trace
suprapatellar joint effusion. Chondrocalcinosis of the lateral
meniscus. Osteopenia. Vascular calcifications.
IMPRESSION: 1.  No acute osseous abnormality.
2. Advanced tricompartmental osteoarthritis of the bilateral knees.

## 2019-10-21 ENCOUNTER — Telehealth: Payer: Medicare Other | Admitting: Family Medicine

## 2019-10-21 ENCOUNTER — Other Ambulatory Visit: Payer: Self-pay

## 2019-10-22 ENCOUNTER — Other Ambulatory Visit: Payer: Self-pay

## 2019-10-22 ENCOUNTER — Telehealth (INDEPENDENT_AMBULATORY_CARE_PROVIDER_SITE_OTHER): Payer: Medicare Other | Admitting: Family Medicine

## 2019-10-22 DIAGNOSIS — R102 Pelvic and perineal pain: Secondary | ICD-10-CM

## 2019-10-22 DIAGNOSIS — R8290 Unspecified abnormal findings in urine: Secondary | ICD-10-CM

## 2019-10-22 DIAGNOSIS — F0390 Unspecified dementia without behavioral disturbance: Secondary | ICD-10-CM | POA: Diagnosis not present

## 2019-10-22 DIAGNOSIS — N3946 Mixed incontinence: Secondary | ICD-10-CM | POA: Diagnosis not present

## 2019-10-22 MED ORDER — NYSTATIN 100000 UNIT/GM EX CREA: 1.0000 "application " | TOPICAL_CREAM | Freq: Two times a day (BID) | CUTANEOUS | 0 refills | Status: DC

## 2019-10-22 NOTE — Progress Notes (Signed)
Virtual Visit Note  I connected with patient on 10/22/19 at 150pm by phone and verified that I am speaking with the correct person using two identifiers. Suzanne Case is currently located at home and patient and daughter is currently with them during visit. The provider, Myles Lipps, MD is located in their office at time of visit.  I discussed the limitations, risks, security and privacy concerns of performing an evaluation and management service by telephone and the availability of in person appointments. I also discussed with the patient that there may be a patient responsible charge related to this service. The patient expressed understanding and agreed to proceed.   CC:   HPI ? PCP: Dr Eldred Manges  Patient with dementia, daughter provides most of history Patient incontinent, uses depends They have been doing vinegar rinses Having vulvar pain, no discharge Does not know if there is a rash Started itchy - treated with vagisil Having dysuria, intermittent Having strong odor in urine - in the past this has been due to a UTI Has also used dermoplast and neosporin No fever, chills, worsening mentation   No Known Allergies  Prior to Admission medications   Medication Sig Start Date End Date Taking? Authorizing Provider  aspirin EC 81 MG tablet Take 81 mg by mouth daily.   Yes [provider]  B Complex-C (B-COMPLEX WITH VITAMIN C) tablet Take 1 tablet by mouth daily.   Yes [provider]  Cholecalciferol (VITAMIN D PO) Take by mouth.   Yes [provider]  CINNAMON PO Take 5 mLs by mouth as directed. Daily with coffee.   Yes [provider]  Coenzyme Q10 (COQ10 PO) Take 1 capsule by mouth daily.   Yes [provider]  CRANBERRY PO Take by mouth.   Yes [provider]  Glucosamine-Chondroitin (GLUCOSAMINE CHONDR COMPLEX PO) Take 1 tablet by mouth daily.   Yes [provider]  magnesium 30 MG tablet Take 30 mg  by mouth 2 (two) times daily.   Yes [provider]  naproxen sodium (ALEVE) 220 MG tablet Take 220 mg by mouth daily as needed (pain).   Yes [provider]  TURMERIC PO Take 1 capsule by mouth daily.   Yes [provider]  vitamin C (ASCORBIC ACID) 500 MG tablet Take 500 mg by mouth daily.   Yes [provider]  VITAMIN E PO Take 1 tablet by mouth daily.   Yes [provider]  Multiple Vitamin (MULTIVITAMIN WITH MINERALS) TABS tablet Take 1 tablet by mouth daily.    [provider]    Past Medical History:  Diagnosis Date  . Arthritis     Past Surgical History:  Procedure Laterality Date  . CESAREAN SECTION    . CESAREAN SECTION      Social History   Tobacco Use  . Smoking status: Never Smoker  . Smokeless tobacco: Never Used  Substance Use Topics  . Alcohol use: No    Alcohol/week: 0.0 standard drinks    Family History  Problem Relation Age of Onset  . Diabetes Mother   . Heart disease Mother   . Hypertension Mother   . Diabetes Father   . Heart disease Father   . Hypertension Father   . Diabetes Sister   . Heart disease Sister   . Hypertension Sister     ROS Per hpi  Objective  Vitals as reported by the patient: none   ASSESSMENT and PLAN  1. Foul smelling  urine - Urinalysis, Routine w reflex microscopic; Future - Urine Culture; Future  2. Vulvar pain Unclear cause wo evaluation which is currently very limited due to her dementia (as she wont wear a mask). Given incontinence, will treat empirically for candidiasis,   3. Mixed stress and urge urinary incontinence  4. Dementia without behavioral disturbance, unspecified dementia type ([place])  Other orders - nystatin cream (MYCOSTATIN); Apply 1 application topically 2 (two) times daily.  FOLLOW-UP: prn   The above assessment and management plan was discussed with the patient. The patient verbalized understanding of and has agreed to the management  plan. Patient is aware to call the clinic if symptoms persist or worsen. Patient is aware when to return to the clinic for a follow-up visit. Patient educated on when it is appropriate to go to the emergency department.    I provided 16 minutes of non-face-to-face time during this encounter.  Rutherford Guys, MD Primary Care at [facility] [place], [place] [address] Ph.  [phone] Fax [phone]

## 2019-10-22 NOTE — Progress Notes (Signed)
Pt's daughter Domenick Gong) says that her mother is having irritation below due to wearing pull ups. One remedy used is rinsing her mother off and letting her go without the depends while allowing her to sit on the portable. Her mother does have dementia so there are challenges with remaining on the portable pot and also taking her vitamins. She is using antibiotics for the irritation.She feels her mother has a uti, the problem with bringing in a specimen is pt will not wear a mask and there is only the 2 in the home.

## 2019-10-24 DIAGNOSIS — R8290 Unspecified abnormal findings in urine: Secondary | ICD-10-CM | POA: Diagnosis not present

## 2019-10-24 NOTE — Addendum Note (Signed)
Addended by: Leim Fabry on: 10/24/2019 04:38 PM   Modules accepted: Orders

## 2019-10-25 ENCOUNTER — Encounter: Payer: Self-pay | Admitting: Family Medicine

## 2019-10-25 LAB — URINALYSIS, ROUTINE W REFLEX MICROSCOPIC
Bilirubin, UA: NEGATIVE
Ketones, UA: NEGATIVE
Nitrite, UA: NEGATIVE
RBC, UA: NEGATIVE
Specific Gravity, UA: 1.028 (ref 1.005–1.030)
Urobilinogen, Ur: 0.2 mg/dL (ref 0.2–1.0)
pH, UA: >=9 — AB (ref 5.0–7.5)

## 2019-10-25 LAB — MICROSCOPIC EXAMINATION
Casts: NONE SEEN /lpf
Epithelial Cells (non renal): >10 /hpf — AB (ref 0–10)

## 2019-10-27 LAB — URINE CULTURE

## 2019-10-28 ENCOUNTER — Telehealth: Payer: Self-pay | Admitting: Family Medicine

## 2019-10-28 MED ORDER — CEPHALEXIN 500 MG PO CAPS: 500.0000 mg | ORAL_CAPSULE | Freq: Two times a day (BID) | ORAL | 0 refills | Status: DC

## 2019-10-28 NOTE — Addendum Note (Signed)
Addended by: Rutherford Guys on: 10/28/2019 08:29 AM   Modules accepted: Orders

## 2019-10-28 NOTE — Telephone Encounter (Signed)
Pt just returning call to Aaron Edelman   She says that if anything she has mychart she was able to see results and everything else

## 2019-11-24 ENCOUNTER — Other Ambulatory Visit: Payer: Self-pay | Admitting: Family Medicine

## 2019-11-25 NOTE — Telephone Encounter (Signed)
Requested medication (s) are due for refill today: yes  Requested medication (s) are on the active medication list: yes  Last refill:  10/22/2019  Future visit scheduled: no  Notes to clinic:  no assigned protocol    Requested Prescriptions  Pending Prescriptions Disp Refills   nystatin cream (MYCOSTATIN) [Pharmacy Med Name: NYSTATIN CREAM 30GM] 30 g 0    Sig: APPLY EXTERNALLY TO THE AFFECTED AREA TWICE DAILY      Off-Protocol Failed - 11/24/2019 11:21 AM      Failed - Medication not assigned to a protocol, review manually.      Passed - Valid encounter within last 12 months    Recent Outpatient Visits           1 month ago Foul smelling urine   [facility], Meda Coffee, MD   6 months ago Recurrent UTI   Primary Care at [facility], Manus Rudd, MD   10 months ago Encounter for Medicare annual wellness exam   Primary Care at [facility], Manus Rudd, MD   1 year ago Hematuria, unspecified type   Primary Care at [facility], Madelaine Bhat, PA-C   1 year ago Dementia without behavioral disturbance, unspecified dementia type   Primary Care at [facility], Manus Rudd, MD

## 2020-01-16 ENCOUNTER — Other Ambulatory Visit: Payer: Self-pay

## 2020-01-16 ENCOUNTER — Other Ambulatory Visit: Payer: Self-pay | Admitting: *Deleted

## 2020-01-16 DIAGNOSIS — R739 Hyperglycemia, unspecified: Secondary | ICD-10-CM

## 2020-01-16 MED ORDER — BLOOD GLUCOSE METER KIT: PACK | 0 refills | Status: DC

## 2020-01-16 MED ORDER — BLOOD GLUCOSE METER KIT: PACK | 0 refills | Status: AC

## 2020-01-17 ENCOUNTER — Telehealth: Payer: Self-pay

## 2020-01-17 ENCOUNTER — Other Ambulatory Visit: Payer: Self-pay | Admitting: Family Medicine

## 2020-01-17 NOTE — Telephone Encounter (Signed)
rx faxed to [facility] for dm supplies and meter

## 2020-01-17 NOTE — Telephone Encounter (Signed)
Requested medication (s) are due for refill today: No  Requested medication (s) are on the active medication list: No  Last refill:  n/a  Future visit scheduled: No  Notes to clinic:  Not on medication list.    Requested Prescriptions  Pending Prescriptions Disp Refills   ONETOUCH VERIO test strip [Pharmacy Med Name: ONE TOUCH VERIO TEST ST(NEW)100'S] 300 strip     Sig: USE TO CHECK BLOOD GLUCOSE UP TO FOUR TIMES DAILY OR AS DIRECTED      Endocrinology: Diabetes - Testing Supplies Passed - 01/17/2020  3:38 PM      Passed - Valid encounter within last 12 months    Recent Outpatient Visits           2 months ago Foul smelling urine   [facility], Meda Coffee, MD   8 months ago Recurrent UTI   [facility] Care at [facility], Manus Rudd, MD   1 year ago Encounter for Medicare annual wellness exam   Primary Care at [facility], Manus Rudd, MD   1 year ago Hematuria, unspecified type   Primary Care at [facility], Madelaine Bhat, PA-C   1 year ago Dementia without behavioral disturbance, unspecified dementia type   [facility], Manus Rudd, MD       Future Appointments             In 1 week Primary Care at [place], [facility]

## 2020-01-27 ENCOUNTER — Ambulatory Visit (INDEPENDENT_AMBULATORY_CARE_PROVIDER_SITE_OTHER): Payer: Medicare Other | Admitting: Family Medicine

## 2020-01-27 VITALS — BP 146/79 | Ht 60.0 in | Wt 182.0 lb

## 2020-01-27 DIAGNOSIS — Z0000 Encounter for general adult medical examination without abnormal findings: Secondary | ICD-10-CM

## 2020-01-27 NOTE — Progress Notes (Signed)
Presents today for [employer] Visit   Date of last exam: 10/22/2019  Interpreter used for this visit?no  I connected with  Angline Schweigert on 01/27/20 by a telephonepplication and verified that I am speaking with the correct person using two identifiers.   I discussed the limitations of evaluation and management by telemedicine. The patient expressed understanding and agreed to proceed.    Patient Care Team: Forrest Moron, MD as PCP - General (Internal Medicine)   Other items to address today:  Discussed with daughter immunizations Daughter is checking blood sugars will send mychart message with readings.  Discussed high's and low's  Is waiting for Wynetta Emery and Wynetta Emery so it can be administer at home.  Per daughter patient is at home communicates with family members through face-time    Other Screening:  Last lipid screening: 01/08/2019  ADVANCE DIRECTIVES: Discussed: yes On File:no Materials Provided: no  Patient has paperwork  Immunization status:  Immunization History  Administered Date(s) Administered  . Pneumococcal Polysaccharide-23 05/25/2016     Health Maintenance Due  Topic Date Due  . TETANUS/TDAP  Never done  . DEXA SCAN  Never done  . PNA vac Low Risk Adult (2 of 2 - PCV13) 05/25/2017     Functional Status Survey: Is the patient deaf or have difficulty hearing?: Yes Does the patient have difficulty seeing, even when wearing glasses/contacts?: No Does the patient have difficulty concentrating, remembering, or making decisions?: Yes Does the patient have difficulty walking or climbing stairs?: Yes Does the patient have difficulty dressing or bathing?: Yes Does the patient have difficulty doing errands alone such as visiting a doctor's office or shopping?: Yes   6CIT Screen 01/27/2020 01/08/2019  What Year? 4 points 4 points  What month? 3 points 3 points  What time? 3 points 0 points  Count back from 20 4 points 4  points  Months in reverse 4 points 4 points  Repeat phrase 10 points 10 points  Total Score 28 25        Clinical Support from 01/27/2020 in [facility] at [facility]  AUDIT-C Score  0       Home Environment:   She uses a walker   Her daughter does all care giving for patient  Patient has demetia  Does not climb stairs Lives on one story home with daughter. No scattered rugs Yes grab bars.  No clutter / adequate lighting   Patient Active Problem List   Diagnosis Date Noted  . Dementia ([place]) 05/18/2018  . Arthritis 05/18/2018  . Muscle weakness 05/18/2018  . UTI (lower urinary tract infection) 05/24/2016  . Fever 05/24/2016  . Tachycardia 05/24/2016     Past Medical History:  Diagnosis Date  . Arthritis      Past Surgical History:  Procedure Laterality Date  . CESAREAN SECTION    . CESAREAN SECTION       Family History  Problem Relation Age of Onset  . Diabetes Mother   . Heart disease Mother   . Hypertension Mother   . Diabetes Father   . Heart disease Father   . Hypertension Father   . Diabetes Sister   . Heart disease Sister   . Hypertension Sister      Social History   Socioeconomic History  . Marital status: Widowed    Spouse name: Not on file  . Number of children: Not on file  . Years of education: Not on file  . Highest  education level: Not on file  Occupational History  . Not on file  Tobacco Use  . Smoking status: Never Smoker  . Smokeless tobacco: Never Used  Substance and Sexual Activity  . Alcohol use: No    Alcohol/week: 0.0 standard drinks  . Drug use: No  . Sexual activity: Not on file  Other Topics Concern  . Not on file  Social History Narrative   ** Merged History Encounter **       Social Determinants of Health   Financial Resource Strain:   . Difficulty of Paying Living Expenses:   Food Insecurity:   . Worried About Charity fundraiser in the Last Year:   . Arboriculturist in the Last Year:   Transportation  Needs:   . Film/video editor (Medical):   Marland Kitchen Lack of Transportation (Non-Medical):   Physical Activity:   . Days of Exercise per Week:   . Minutes of Exercise per Session:   Stress:   . Feeling of Stress :   Social Connections:   . Frequency of Communication with Friends and Family:   . Frequency of Social Gatherings with Friends and Family:   . Attends Religious Services:   . Active Member of Clubs or Organizations:   . Attends Archivist Meetings:   Marland Kitchen Marital Status:   Intimate Partner Violence:   . Fear of Current or Ex-Partner:   . Emotionally Abused:   Marland Kitchen Physically Abused:   . Sexually Abused:      No Known Allergies   Prior to Admission medications   Medication Sig Start Date End Date Taking? Authorizing Provider  aspirin EC 81 MG tablet Take 81 mg by mouth daily.   Yes [provider]  B Complex-C (B-COMPLEX WITH VITAMIN C) tablet Take 1 tablet by mouth daily.   Yes [provider]  blood glucose meter kit and supplies Dispense based on patient and insurance preference. Use up to four times daily as directed. (FOR ICD-10 E10.9, E11.9). 01/16/20  Yes Forrest Moron, MD  Cholecalciferol (VITAMIN D PO) Take by mouth.   Yes [provider]  CINNAMON PO Take 5 mLs by mouth as directed. Daily with coffee.   Yes [provider]  Coenzyme Q10 (COQ10 PO) Take 1 capsule by mouth daily.   Yes [provider]  CRANBERRY PO Take by mouth.   Yes [provider]  magnesium 30 MG tablet Take 30 mg by mouth 2 (two) times daily.   Yes [provider]  Multiple Vitamin (MULTIVITAMIN WITH MINERALS) TABS tablet Take 1 tablet by mouth daily.   Yes [provider]  naproxen sodium (ALEVE) 220 MG tablet Take 220 mg by mouth daily as needed (pain).   Yes [provider]  Omega-3 Fatty Acids (FISH OIL) 1000 MG CAPS Take by mouth.   Yes [provider]  vitamin C (ASCORBIC ACID) 500 MG tablet  Take 500 mg by mouth daily.   Yes [provider]  VITAMIN E PO Take 1 tablet by mouth daily.   Yes [provider]  cephALEXin (KEFLEX) 500 MG capsule Take 1 capsule (500 mg total) by mouth 2 (two) times daily. 10/28/19   Rutherford Guys, MD  Glucosamine-Chondroitin (GLUCOSAMINE CHONDR COMPLEX PO) Take 1 tablet by mouth daily.    [provider]  nystatin cream (MYCOSTATIN) APPLY EXTERNALLY TO THE AFFECTED AREA TWICE DAILY Patient not taking: Reported on 01/27/2020 11/27/19   Forrest Moron,  MD  TURMERIC PO Take 1 capsule by mouth daily.    [provider]     Depression screen [facility] 2/9 01/27/2020 01/08/2019 10/06/2018 06/07/2018 05/18/2018  Decreased Interest 0 0 0 0 0  Down, Depressed, Hopeless 0 0 0 0 0  PHQ - 2 Score 0 0 0 0 0     Fall Risk  01/27/2020 10/22/2019 05/09/2019 01/08/2019 10/06/2018  Falls in the past year? 0 - 0 0 0  Number falls in past yr: 0 - 0 - -  Injury with Fall? 0 - 0 - -  Risk Factor Category  - - - - -  Risk for fall due to : - Mental status change - - -  Follow up Falls evaluation completed;Education provided - - Falls evaluation completed -      PHYSICAL EXAM: There were no vitals taken for this visit.   Wt Readings from Last 3 Encounters:  01/08/19 182 lb (82.6 kg)  10/06/18 173 lb (78.5 kg)  06/07/18 173 lb 3.2 oz (78.6 kg)       Education/Counseling provided regarding diet and exercise, prevention of chronic diseases, smoking/tobacco cessation, if applicable, and reviewed "Covered Medicare Preventive Services."

## 2020-01-27 NOTE — Patient Instructions (Addendum)
Thank you for taking time to come for your Medicare Wellness Visit. I appreciate your ongoing commitment to your health goals. Please review the following plan we discussed and let me know if I can assist you in the future.  Suzanne Rosten LPN  Preventive Care 84 Years and Older, Female Preventive care refers to lifestyle choices and visits with your health care provider that can promote health and wellness. This includes:  A yearly physical exam. This is also called an annual well check.  Regular dental and eye exams.  Immunizations.  Screening for certain conditions.  Healthy lifestyle choices, such as diet and exercise. What can I expect for my preventive care visit? Physical exam Your health care provider will check:  Height and weight. These may be used to calculate body mass index (BMI), which is a measurement that tells if you are at a healthy weight.  Heart rate and blood pressure.  Your skin for abnormal spots. Counseling Your health care provider may ask you questions about:  Alcohol, tobacco, and drug use.  Emotional well-being.  Home and relationship well-being.  Sexual activity.  Eating habits.  History of falls.  Memory and ability to understand (cognition).  Work and work environment.  Pregnancy and menstrual history. What immunizations do I need?  Influenza (flu) vaccine  This is recommended every year. Tetanus, diphtheria, and pertussis (Tdap) vaccine  You may need a Td booster every 10 years. Varicella (chickenpox) vaccine  You may need this vaccine if you have not already been vaccinated. Zoster (shingles) vaccine  You may need this after age 60. Pneumococcal conjugate (PCV13) vaccine  One dose is recommended after age 84. Pneumococcal polysaccharide (PPSV23) vaccine  One dose is recommended after age 84. Measles, mumps, and rubella (MMR) vaccine  You may need at least one dose of MMR if you were born in 1957 or later. You may also  need a second dose. Meningococcal conjugate (MenACWY) vaccine  You may need this if you have certain conditions. Hepatitis A vaccine  You may need this if you have certain conditions or if you travel or work in places where you may be exposed to hepatitis A. Hepatitis B vaccine  You may need this if you have certain conditions or if you travel or work in places where you may be exposed to hepatitis B. Haemophilus influenzae type b (Hib) vaccine  You may need this if you have certain conditions. You may receive vaccines as individual doses or as more than one vaccine together in one shot (combination vaccines). Talk with your health care provider about the risks and benefits of combination vaccines. What tests do I need? Blood tests  Lipid and cholesterol levels. These may be checked every 5 years, or more frequently depending on your overall health.  Hepatitis C test.  Hepatitis B test. Screening  Lung cancer screening. You may have this screening every year starting at age 55 if you have a 30-pack-year history of smoking and currently smoke or have quit within the past 15 years.  Colorectal cancer screening. All adults should have this screening starting at age 50 and continuing until age 75. Your health care provider may recommend screening at age 45 if you are at increased risk. You will have tests every 1-10 years, depending on your results and the type of screening test.  Diabetes screening. This is done by checking your blood sugar (glucose) after you have not eaten for a while (fasting). You may have this done every 1-3   years.  Mammogram. This may be done every 1-2 years. Talk with your health care provider about how often you should have regular mammograms.  BRCA-related cancer screening. This may be done if you have a family history of breast, ovarian, tubal, or peritoneal cancers. Other tests  Sexually transmitted disease (STD) testing.  Bone density scan. This is done  to screen for osteoporosis. You may have this done starting at age 84. Follow these instructions at home: Eating and drinking  Eat a diet that includes fresh fruits and vegetables, whole grains, lean protein, and low-fat dairy products. Limit your intake of foods with high amounts of sugar, saturated fats, and salt.  Take vitamin and mineral supplements as recommended by your health care provider.  Do not drink alcohol if your health care provider tells you not to drink.  If you drink alcohol: ? Limit how much you have to 0-1 drink a day. ? Be aware of how much alcohol is in your drink. In the [place] one drink equals one 12 oz bottle of beer (355 mL), one 5 oz glass of wine (148 mL), or one 1 oz glass of hard liquor (44 mL). Lifestyle  Take daily care of your teeth and gums.  Stay active. Exercise for at least 30 minutes on 5 or more days each week.  Do not use any products that contain nicotine or tobacco, such as cigarettes, e-cigarettes, and chewing tobacco. If you need help quitting, ask your health care provider.  If you are sexually active, practice safe sex. Use a condom or other form of protection in order to prevent STIs (sexually transmitted infections).  Talk with your health care provider about taking a low-dose aspirin or statin. What's next?  Go to your health care provider once a year for a well check visit.  Ask your health care provider how often you should have your eyes and teeth checked.  Stay up to date on all vaccines. This information is not intended to replace advice given to you by your health care provider. Make sure you discuss any questions you have with your health care provider. Document Revised: 10/18/2018 Document Reviewed: 10/18/2018 Elsevier Patient Education  2020 [employer].

## 2020-02-14 ENCOUNTER — Encounter: Payer: Self-pay | Admitting: Family Medicine

## 2020-03-10 ENCOUNTER — Encounter: Payer: Self-pay | Admitting: Family Medicine

## 2020-03-11 ENCOUNTER — Encounter: Payer: Self-pay | Admitting: Family Medicine

## 2020-03-11 DIAGNOSIS — R296 Repeated falls: Secondary | ICD-10-CM

## 2020-03-11 DIAGNOSIS — F0390 Unspecified dementia without behavioral disturbance: Secondary | ICD-10-CM

## 2020-03-11 DIAGNOSIS — R6889 Other general symptoms and signs: Secondary | ICD-10-CM

## 2020-03-11 NOTE — Telephone Encounter (Signed)
Patient fell out of bed and EMS told patient to see if you could order a hospital bed for her. Please Advise.

## 2020-03-12 ENCOUNTER — Telehealth: Payer: Self-pay

## 2020-03-12 ENCOUNTER — Telehealth: Payer: Self-pay | Admitting: Family Medicine

## 2020-03-12 NOTE — Telephone Encounter (Signed)
Pt daughter called back states that [facility] will be the company that will furnish mothers bed. Office notes and tel encounters were faxed to [facility] for review at [phone]

## 2020-03-12 NOTE — Telephone Encounter (Signed)
Please work with family/insurance on this issue. thanks

## 2020-03-12 NOTE — Telephone Encounter (Signed)
Called the daughter for pt to inform her that [facility] does not handle hosp beds. Left message

## 2020-03-12 NOTE — Telephone Encounter (Signed)
Hand written rx given to [facility], CMA

## 2020-03-12 NOTE — Telephone Encounter (Signed)
Spoke with Lisbeth Renshaw (pt daughter) she will be calling ins to get  info of dme coverage for pt hospital bed. Rx written by [place], copy made to be scanned for pt chart.

## 2020-03-12 NOTE — Telephone Encounter (Signed)
I have spoken to Lisbeth Renshaw whom is the pt daughter, She is contacting the insurance company to see what dme company insurance will pay for. She will get bk to me ones she gets the info. I have the rx here at the nurses station

## 2020-03-12 NOTE — Telephone Encounter (Signed)
[facility] is calling received an order for hospital bed . , not a service they provide .

## 2020-03-18 ENCOUNTER — Encounter: Payer: Self-pay | Admitting: Family Medicine

## 2020-03-19 NOTE — Telephone Encounter (Signed)
Attached is the list of DME providers covered by her insurance. The only ones I really know are [facility], [facility] and [facility]. Please call and investigate further. You have hand written rx.  thanks

## 2020-03-24 ENCOUNTER — Telehealth: Payer: Self-pay | Admitting: Family Medicine

## 2020-03-24 DIAGNOSIS — R6889 Other general symptoms and signs: Secondary | ICD-10-CM

## 2020-03-24 NOTE — Telephone Encounter (Signed)
Please F/U with this for pt.

## 2020-03-24 NOTE — Telephone Encounter (Signed)
Pt daughter has not heard from anyone on status of bed since she sent a list on mychart message . Asking for Lowella Bandy to call her back with status   Please call Janell at [phone]

## 2020-03-26 NOTE — Telephone Encounter (Signed)
Pt has appt with mychart visit with Alwyn Ren tomorrow, visit will be with the daughter of the pt.to speak of medical neccessity of the hospital bed, office notes will be faxed to dme company after visit

## 2020-03-26 NOTE — Telephone Encounter (Signed)
Spoke with Asher Muir at [facility] located in [place], they do have the hosp bed pt is needing. Order will be faxed for processing

## 2020-03-26 NOTE — Telephone Encounter (Signed)
Calling list of dme companies to see which company will furnish a bed for the pt

## 2020-03-27 ENCOUNTER — Telehealth: Payer: Self-pay

## 2020-03-27 ENCOUNTER — Encounter: Payer: Self-pay | Admitting: Family Medicine

## 2020-03-27 ENCOUNTER — Telehealth (INDEPENDENT_AMBULATORY_CARE_PROVIDER_SITE_OTHER): Payer: Medicare Other | Admitting: Family Medicine

## 2020-03-27 ENCOUNTER — Other Ambulatory Visit: Payer: Self-pay

## 2020-03-27 DIAGNOSIS — R269 Unspecified abnormalities of gait and mobility: Secondary | ICD-10-CM | POA: Diagnosis not present

## 2020-03-27 DIAGNOSIS — W06XXXA Fall from bed, initial encounter: Secondary | ICD-10-CM

## 2020-03-27 DIAGNOSIS — G308 Other Alzheimer's disease: Secondary | ICD-10-CM

## 2020-03-27 DIAGNOSIS — F0280 Dementia in other diseases classified elsewhere without behavioral disturbance: Secondary | ICD-10-CM

## 2020-03-27 NOTE — Patient Instructions (Addendum)
Order is being placed for a hospital bed for use at home.  Suggested if necessary they try calling [email or web address] [name] [address] [place], [place] [address] Telephone: [phone] To see if they can help in her getting a Covid vaccination at home or other assistance.  Recommend contacting the following people to try and set up for a provider to do home visits possibly: [facility] Office Location [address] [name], [place] [address] [facility] [place] Phone : [phone] E-mail : [email or web address] Website : [email or web address]  I hope this is of assistance, but otherwise try to set up a time that you can bring her in to see Dr. Leretha Pol when you have enough family members to help get her here.  [name]. Alwyn Ren, MD

## 2020-03-27 NOTE — Progress Notes (Addendum)
Patient ID: Suzanne Case, female    DOB: June 06, 1927  Age: 86 y.o. MRN: [id number]  No chief complaint on file.   Subjective:   Telemedicine visit: Telephone only.  Patient's daughter Suzanne Case identified herself appropriately and gave appropriate identification of patient who has Alzheimer.  They understand that insurance will be billed for the visit and they may be responsible for portions.  Patient is mostly homebound.  Daughter cannot bring her in by herself, but another relative who has had to go to [place] will come back at some point and they will try and bring her in with two-person assistance at that time.  The patient is able to be helped up and can ambulate with a walker with assistance.  She is confused with Alzheimer's.  A couple weeks ago she had a fall from the bed and the EMS finders recommended that she get a hospital bed to help them position her and get her up and down.  Fortunately she was not injured.  She has come to this practice and seen Dr. Creta Levin in the past, but Dr. Creta Levin is no longer here.  Communication has been made with Dr. Leretha Pol but patient cannot be brought in at this time.  She has not yet had a Covid vaccination and they would like to find someone to come to the home to give the Covid shot to her.    Current allergies, medications, problem list, past/family and social histories reviewed.  Objective:  There were no vitals taken for this visit. No examination, telephone visit  Assessment & Plan:   Assessment: 1. Fall from bed, initial encounter   2. Gait disturbance   3. Alzheimer's disease of other onset without behavioral disturbance (HCC)       Plan: 30-40 min counseling and advice on phone and finding needed information for them  Orders Placed This Encounter  Procedures  . For home use only DME Hospital bed    Need to be able to position bed to move patient in and of bed.  After recent fall from bed the EMS responders  recommended she get a hospital bed to help her.    Order Specific Question:   Length of Need    Answer:   Lifetime    Order Specific Question:   Patient has (list medical condition):    Answer:   alzheimer's, fall from bed, confusion    Order Specific Question:   The above medical condition requires:    Answer:   Patient requires the ability to reposition frequently    Order Specific Question:   Bed type    Answer:   Semi-electric    No orders of the defined types were placed in this encounter.        Patient Instructions  Order is being placed for a hospital bed for use at home.  Suggested if necessary they try calling [email or web address] [name] [facility] [address] [place], [place] [address] Telephone: [phone] To see if they can help in her getting a Covid vaccination at home or other assistance.  Recommend contacting the following people to try and set up for a provider to do home visits possibly: [facility] Office Location [address] [name], [place] [address] [facility] [place] Phone : [phone] E-mail : [email or web address] Website : [email or web address]  I hope this is of assistance, but otherwise try to set up a time that you can bring her in to see Dr. Leretha Pol when you  have enough family members to help get her here.  Fenton Malling. Linna Darner, MD      Return if symptoms worsen or fail to improve.   Ruben Reason, MD 03/27/2020

## 2020-03-27 NOTE — Telephone Encounter (Signed)
Faxed order to [facility] and [facility] for pt hosp bed and other services

## 2020-03-30 NOTE — Telephone Encounter (Signed)
Forms have been faxed to [facility] for pt service

## 2020-04-28 ENCOUNTER — Encounter: Payer: Self-pay | Admitting: Family Medicine

## 2020-05-12 DIAGNOSIS — M6281 Muscle weakness (generalized): Secondary | ICD-10-CM | POA: Diagnosis not present

## 2020-12-13 DIAGNOSIS — M6281 Muscle weakness (generalized): Secondary | ICD-10-CM | POA: Diagnosis not present

## 2021-01-10 DIAGNOSIS — M6281 Muscle weakness (generalized): Secondary | ICD-10-CM | POA: Diagnosis not present

## 2021-02-10 DIAGNOSIS — M6281 Muscle weakness (generalized): Secondary | ICD-10-CM | POA: Diagnosis not present

## 2022-05-05 ENCOUNTER — Telehealth: Payer: Self-pay

## 2022-05-05 NOTE — Telephone Encounter (Signed)
Spoke with patient's daughter [name] and scheduled a telephonic Palliative Consult for 05/16/22 @ 1:30 PM.   Consent obtained; updated Netsmart, Team List and Epic.

## 2022-05-05 NOTE — Telephone Encounter (Signed)
Attempted to contact patient's daughter [name] to schedule a Palliative Care consult appointment. No answer left a message to return call.

## 2022-05-15 NOTE — Progress Notes (Unsigned)
[facility] Consult Note Telephone: [phone]  Fax: [phone]   Date of encounter: 05/15/22 3:45 PM PATIENT NAME: Suzanne Case [place] [address]   [phone] (home)  DOB: 01-25-1927 MRN: [id number] PRIMARY CARE PROVIDER:    Clovia Cuff, MD,  [address] [place] [address] [phone]  REFERRING PROVIDER:   Dr. Daphene Jaeger  RESPONSIBLE PARTY:    Contact Information     Name Relation Home Work Mobile   Ryer,Janell Daughter [phone]  [phone]   Suzanne Case, Suzanne Case [phone]          Due to the COVID-19 crisis, this visit was done via telemedicine from my office and it was initiated and consent by this patient and or family.  I connected with  Rheba Diamond OR PROX[facility] on 05/15/22 by a telemedicine application and verified that I am speaking with the correct person using two identifiers.  This patient did not have the ability to connect via a video-enabled capacity.   I discussed the limitations of evaluation and management by telemedicine. The patient expressed understanding and agreed to proceed.  Palliative Care was asked to follow this patient by consultation request of  Dr. Daphene Jaeger to address advance care planning and complex medical decision making. This is the initial visit.                                     ASSESSMENT AND PLAN / RECOMMENDATIONS:   Advance Care Planning/Goals of Care: Goals include to maximize quality of life and symptom management. Patient/health care surrogate gave his/her permission to discuss.Our advance care planning conversation included a discussion about:    The value and importance of advance care planning  Experiences with loved ones who have been seriously ill or have died  Exploration of personal, cultural or spiritual beliefs that might influence medical decisions  Exploration of goals of care in the event of a sudden injury  or illness  Identification  of a healthcare agent--Janelle; does not have official HCPOA, but all children understand that she does not want CPR, does not want to be in the hospital to die--wants to be at home Review and updating or creation of an  advance directive document . Decision not to resuscitate or to de-escalate disease focused treatments due to poor prognosis. CODE STATUS:  MOST form completed 05/26/20:  DNR, comfort measures, abx if indicated, IVF if indicated, no feeding tube  Symptom Management/Plan: 1. Dysuria -has not had a UTI since Dec but presented last time with dysuria, also, is very drowsy today, as well -Leeanne Rio has been unable to obtain a urine specimen to get her urine tested, but she responded well to augmentin in 12/22 so opted to treat empirically - amoxicillin-clavulanate (AUGMENTIN) 875-125 MG tablet; Take 1 tablet by mouth 2 (two) times daily.  Dispense: 20 tablet; Refill: 0 -reviewed good hygiene practices  2. Severe dementia without behavioral disturbance, psychotic disturbance, mood disturbance, or anxiety, unspecified dementia type ([place]) -dependent in adls and weak, incontinent of bladder, does not always recognize family -still verbal and eating, does not like to drink water or take pills -continue support from daughter, will discuss more resources at in-home visit  3. Palliative care by specialist -will ask RN to see her in f/u in next two weeks due to edema and skin tear that's not healing -then I'll follow-up in person next time  Follow up Palliative Care Visit: Palliative care will continue to follow for complex medical decision making, advance care planning, and clarification of goals. Return 07/07/2022.  Will ask RN to f/u between due to LE swelling concerns.   30 mins spent on televisit.    PPS: 40%  HOSPICE ELIGIBILITY/DIAGNOSIS: not at this point/dementia  Chief Complaint: Initial palliative care consult via telephone  HISTORY OF PRESENT  ILLNESS:  Suzanne Case is a 88 y.o. year old female  with dementia, osteoarthritis, weakness and h/o UTIs spoken with a long with her daughter, Angus Palms for palliative consult. Dr. Altamese Dilling practice follows her at home.    She has frequent UTIs.  She's got urinary incontinence.  She's been having dysuria and malodorous urine.  Nystatin is being used for yeast and that helps some discomfort.  10/14/21 she was treated for UTI.  She was given amoxicillin.    Her left leg hangs off the bed when she's sleeping.  Leeanne Rio has tried to put the compression socks on but to no avail.  She now has a skin tear on her leg.  She's been putting antibiotic ointment and cleaning with peroxide.  The skin on that leg is very dry so she uses baby oil on there.  Her feet bother her and swell a lot.  She elevates her feet and cuts her salt intake.    She does not like to drink water--only her youngest son can get her to drink water.  She will drink apple juice and tea.  She will eat watermelon, cantaloupe, cucumbers if nearly pickled.    Leeanne Rio is wanting to keep her mom out of the hospital.  She's not a pilltaker and doesn't like to take many pills.  Likes natural things.  She eats pretty well.  They didn't have power yesterday and that threw her day off--had takeout and ate chinese well.  She has good and bad nights for sleep.  They have a camera in her room b/c she was getting up.  They have an LED light that changes colors and helps her rest.  She also plays soft hymns.  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  Moves bowels well with probiotic.  Janell bathes and dresses her.  Pt will brush her teeth after prep.  She also toilets her.  Sometimes gets up to go, but fears falling.  Janell wipes her.  No falls in 3 years.  Pt does not always know her daughter.  She has both a walker and cane.  Primarily uses walker.  No longer leaves home.  Cannot get in car.  Pt's son would have to lift her into  car. Last time out was her 89th bday party at a restaurant.    History obtained from review of EMR, discussion with primary team, and interview with family, facility staff/caregiver and/or Ms. Barnette.   I reviewed available labs, medications, imaging, studies and related documents from the EMR.  Records reviewed and summarized above.   ROS Review of Systems--see hpi  Physical Exam: Current and past weights:  she is afraid of falling and cannot stand on scale; they will try to get a weight   Wt Readings from Last 500 Encounters:  01/27/20 182 lb (82.6 kg)  01/08/19 182 lb (82.6 kg)  10/06/18 173 lb (78.5 kg)  06/07/18 173 lb 3.2 oz (78.6 kg)  07/02/16 172 lb (78 kg)  05/24/16 185 lb 3 oz (84 kg)  04/10/15 194 lb (88 kg)  04/07/15 194 lb (88 kg)  04/05/15 198 lb (89.8 kg)    CURRENT PROBLEM LIST:  Patient Active Problem List   Diagnosis Date Noted   Dementia ([place]) 05/18/2018   Arthritis 05/18/2018   Muscle weakness 05/18/2018   UTI (lower urinary tract infection) 05/24/2016   Fever 05/24/2016   Tachycardia 05/24/2016   PAST MEDICAL HISTORY:  Active Ambulatory Problems    Diagnosis Date Noted   UTI (lower urinary tract infection) 05/24/2016   Fever 05/24/2016   Tachycardia 05/24/2016   Dementia ([place]) 05/18/2018   Arthritis 05/18/2018   Muscle weakness 05/18/2018   Resolved Ambulatory Problems    Diagnosis Date Noted   No Resolved Ambulatory Problems   No Additional Past Medical History   SOCIAL HX:  Social History   Tobacco Use   Smoking status: Never   Smokeless tobacco: Never  Substance Use Topics   Alcohol use: No    Alcohol/week: 0.0 standard drinks of alcohol   FAMILY HX:  Family History  Problem Relation Age of Onset   Diabetes Mother    Heart disease Mother    Hypertension Mother    Diabetes Father    Heart disease Father    Hypertension Father    Diabetes Sister    Heart disease Sister    Hypertension Sister       ALLERGIES: No Known  Allergies    PERTINENT MEDICATIONS:  Outpatient Encounter Medications as of 05/16/2022  Medication Sig   aspirin EC 81 MG tablet Take 81 mg by mouth daily.   B Complex-C (B-COMPLEX WITH VITAMIN C) tablet Take 1 tablet by mouth daily.   blood glucose meter kit and supplies Dispense based on patient and insurance preference. Use up to four times daily as directed. (FOR ICD-10 E10.9, E11.9).   cephALEXin (KEFLEX) 500 MG capsule Take 1 capsule (500 mg total) by mouth 2 (two) times daily.   Cholecalciferol (VITAMIN D PO) Take by mouth.   CINNAMON PO Take 5 mLs by mouth as directed. Daily with coffee.   Coenzyme Q10 (COQ10 PO) Take 1 capsule by mouth daily.   CRANBERRY PO Take by mouth.   Glucosamine-Chondroitin (GLUCOSAMINE CHONDR COMPLEX PO) Take 1 tablet by mouth daily.   magnesium 30 MG tablet Take 30 mg by mouth 2 (two) times daily.   Multiple Vitamin (MULTIVITAMIN WITH MINERALS) TABS tablet Take 1 tablet by mouth daily.   naproxen sodium (ALEVE) 220 MG tablet Take 220 mg by mouth daily as needed (pain).   nystatin cream (MYCOSTATIN) APPLY EXTERNALLY TO THE AFFECTED AREA TWICE DAILY (Patient not taking: Reported on 01/27/2020)   Omega-3 Fatty Acids (FISH OIL) 1000 MG CAPS Take by mouth.   TURMERIC PO Take 1 capsule by mouth daily.   vitamin C (ASCORBIC ACID) 500 MG tablet Take 500 mg by mouth daily.   VITAMIN E PO Take 1 tablet by mouth daily.   No facility-administered encounter medications on file as of 05/16/2022.    Thank you for the opportunity to participate in the care of Ms. Bouie.  The palliative care team will continue to follow. Please call our office at [phone] if we can be of additional assistance.   Hollace Kinnier, DO  COVID-19 PATIENT SCREENING TOOL Asked and negative response unless otherwise noted:  Have you had symptoms of covid, tested positive or been in contact with someone with symptoms/positive test in the past 5-10 days?  NO

## 2022-05-16 ENCOUNTER — Other Ambulatory Visit: Payer: Medicare Other | Admitting: Internal Medicine

## 2022-05-16 ENCOUNTER — Encounter: Payer: Self-pay | Admitting: Internal Medicine

## 2022-05-16 DIAGNOSIS — F03C0 Unspecified dementia, severe, without behavioral disturbance, psychotic disturbance, mood disturbance, and anxiety: Secondary | ICD-10-CM

## 2022-05-16 DIAGNOSIS — Z515 Encounter for palliative care: Secondary | ICD-10-CM

## 2022-05-16 DIAGNOSIS — R300 Dysuria: Secondary | ICD-10-CM

## 2022-05-16 MED ORDER — AMOXICILLIN-POT CLAVULANATE 875-125 MG PO TABS: 1.0000 | ORAL_TABLET | Freq: Two times a day (BID) | ORAL | 0 refills | Status: AC

## 2022-06-02 ENCOUNTER — Other Ambulatory Visit: Payer: Medicare Other | Admitting: *Deleted

## 2022-07-07 ENCOUNTER — Encounter: Payer: Self-pay | Admitting: Internal Medicine

## 2022-07-07 ENCOUNTER — Other Ambulatory Visit: Payer: Medicare Other | Admitting: Internal Medicine

## 2022-07-07 VITALS — BP 110/60 | HR 72 | Temp 97.7°F

## 2022-07-07 DIAGNOSIS — Z515 Encounter for palliative care: Secondary | ICD-10-CM

## 2022-07-07 DIAGNOSIS — R300 Dysuria: Secondary | ICD-10-CM

## 2022-07-07 DIAGNOSIS — I872 Venous insufficiency (chronic) (peripheral): Secondary | ICD-10-CM

## 2022-07-07 DIAGNOSIS — F03C0 Unspecified dementia, severe, without behavioral disturbance, psychotic disturbance, mood disturbance, and anxiety: Secondary | ICD-10-CM

## 2022-07-07 DIAGNOSIS — M170 Bilateral primary osteoarthritis of knee: Secondary | ICD-10-CM

## 2022-07-07 NOTE — Progress Notes (Signed)
[facility] Pall[facility] Care Follow-Up Visit Telephone: [phone]  Fax: [phone]   Date of encounter: 07/07/22 8:55 PM PATIENT NAME: Suzanne Case [place] [address]   [phone] (home)  DOB: 1927/09/12 MRN: [id number] PRIMARY CARE PROVIDER:    Clovia Cuff, MD,  [address] [place] [address] [phone]  REFERRING PROVIDER:   Clovia Cuff, MD [address] [place],  [place] [address] [phone]  RESPONSIBLE PARTY:    Contact Information     Name Relation Home Work Mobile   [place] Daughter [phone]  [phone]   [place], [place] [phone]          I met face to face with patient and family in her home. Palliative Care was asked to follow this patient by consultation request of  Clovia Cuff, MD to address advance care planning and complex medical decision making. This is follow-up visit.                                     ASSESSMENT AND PLAN / RECOMMENDATIONS:   Advance Care Planning/Goals of Care: Goals include to maximize quality of life and symptom management. Patient/health care surrogate gave his/her permission to discuss.Our advance care planning conversation included a discussion about:    The value and importance of advance care planning  Experiences with loved ones who have been seriously ill or have died  Exploration of personal, cultural or spiritual beliefs that might influence medical decisions  Exploration of goals of care in the event of a sudden injury or illness  Identification  of a healthcare agent  Review and updating or creation of an  advance directive document . Decision not to resuscitate or to de-escalate disease focused treatments due to poor prognosis. CODE STATUS:  DNR, MOST on file Education provided about feeding tubes  in dementia as pt previously had not made a decision about this.    Symptom Management/Plan: 1. Severe  dementia without behavioral disturbance, psychotic disturbance, mood disturbance, or anxiety, unspecified dementia type ([place]) -is requiring assistance with all ADLs at this time except feeds herself, minimally ambulatory, eating well with good appetite, 24x7 care by her daughter  -no new needs noted today  2. Dysuria -resolved, no current delirium or UTI symptoms  3. Primary osteoarthritis of both knees -continue topicals, using aleve and mix of tylenol and nsaid depending on time of year--did discuss effects of nsaids on kidneys, also taking tumeric  4. Palliative care by specialist -reviewed goals to remain at home through end of life--no SNFs, reviewed ACP documents, MOST and educated about tube feeding in dementia  5.  Venous insufficiency:  educated about process, elevation of feet at rest, compression stocking use -discussed that purpose is to avoid swelling to such an extent that skin breakdown occurs -we decided to allow her to keep her left leg hanging off the bed rather than adding a full rail to the bed (has half rails) at this point b/c focus is comfort and her daughter expects her back and hip pain will worsen if she cannot hang her foot out -she's not in danger of sliding out of bed b/c she uses the covers and pillows to position her otherwise  Follow up Palliative Care Visit: Palliative care will continue to follow for complex medical decision making, advance care planning, and clarification of goals. Return 09/06/2022 And prn.   This visit  was coded based on medical decision making (MDM).  17 mins spent on ACP  PPS: 40%  HOSPICE ELIGIBILITY/DIAGNOSIS: TBD  Chief Complaint: Follow-up palliative visit  HISTORY OF PRESENT ILLNESS:  Suzanne Case is a 88 y.o. year old female  with htn, hyperlipidemia, obesity, venous insufficiency, dementia and OA of knees, some chronic lower back and hip pain seen for first in home visit from palliative.   Takes daily turmeric for  OA, also. Pt has back, hip and knee pains.    Plans to bring someone else in 2-3 days per week to help her.     Doing much better after treated with augmentin for UTI.    Suzanne Case uses a tub to help wash her except her private area which she does herself with instructions.  She now needs to be coached to push her bms out.  Does have incontinence real bad.    She has not walked from room to room for 3 months.  Only in her bedroom.    Reviewed MOST with Suzanne Case.  Pt's husband had tube feeding.  Pt refuses to go to SNF and they had a bad experience with her husband.    History obtained from review of EMR, discussion with primary team, and interview with family, facility staff/caregiver and/or Ms. Leflore.   I reviewed available labs, medications, imaging, studies and related documents from the EMR.  Records reviewed and summarized above.   ROS Review of Systems  Constitutional:  Positive for activity change and fatigue. Negative for appetite change, chills and fever.  HENT:  Negative for congestion and trouble swallowing.   Respiratory:  Negative for chest tightness and shortness of breath.   Cardiovascular:  Positive for leg swelling. Negative for chest pain and palpitations.  Gastrointestinal:  Negative for constipation.  Genitourinary:  Negative for dysuria.       Incontinence of bladder; uses bedside commode--bathroom way too small and design not ideal  Musculoskeletal:  Positive for arthralgias, back pain and gait problem.       Left greater than right knee pain worse with ambulation even short distances and transfers  Skin:  Negative for color change.  Neurological:  Positive for weakness. Negative for dizziness.  Psychiatric/Behavioral:  Positive for confusion and sleep disturbance.        Sleep fluctuates    Physical Exam: Vitals:   07/07/22 2043  BP: 110/60  Pulse: 72  Temp: 97.7 F (36.5 C)  SpO2: 93%   There is no height or weight on file to calculate BMI. Wt  Readings from Last 500 Encounters:  01/27/20 182 lb (82.6 kg)  01/08/19 182 lb (82.6 kg)  10/06/18 173 lb (78.5 kg)  06/07/18 173 lb 3.2 oz (78.6 kg)  07/02/16 172 lb (78 kg)  05/24/16 185 lb 3 oz (84 kg)  04/10/15 194 lb (88 kg)  04/07/15 194 lb (88 kg)  04/05/15 198 lb (89.8 kg)   Physical Exam Vitals reviewed.  Constitutional:      Appearance: Normal appearance. She is obese.  HENT:     Head: Normocephalic and atraumatic.  Eyes:     Comments: glasses  Cardiovascular:     Rate and Rhythm: Normal rate and regular rhythm.     Heart sounds: Murmur heard.  Pulmonary:     Effort: Pulmonary effort is normal.     Breath sounds: Normal breath sounds. No rales.  Abdominal:     General: Abdomen is flat. Bowel sounds are normal.  Palpations: Abdomen is soft.  Musculoskeletal:        General: Tenderness present. Normal range of motion.     Right lower leg: Edema present.     Left lower leg: Edema present.     Comments: Left knee mildly tender   Skin:    General: Skin is warm and dry.     Comments: No ulcers; has thickened mycotic toenails and hyperpigmentation of skin of lower legs left greater than right  Neurological:     Mental Status: She is alert.     Motor: Weakness present.     Gait: Gait abnormal.  Psychiatric:        Mood and Affect: Mood normal.     CURRENT PROBLEM LIST:  Patient Active Problem List   Diagnosis Date Noted   Dementia ([place]) 05/18/2018   Arthritis 05/18/2018   Muscle weakness 05/18/2018   UTI (lower urinary tract infection) 05/24/2016   Fever 05/24/2016   Tachycardia 05/24/2016    PAST MEDICAL HISTORY:  Active Ambulatory Problems    Diagnosis Date Noted   UTI (lower urinary tract infection) 05/24/2016   Fever 05/24/2016   Tachycardia 05/24/2016   Dementia ([place]) 05/18/2018   Arthritis 05/18/2018   Muscle weakness 05/18/2018   Resolved Ambulatory Problems    Diagnosis Date Noted   No Resolved Ambulatory Problems   No Additional Past  Medical History    SOCIAL HX:  Social History   Tobacco Use   Smoking status: Never   Smokeless tobacco: Never  Substance Use Topics   Alcohol use: No    Alcohol/week: 0.0 standard drinks of alcohol     ALLERGIES: No Known Allergies    PERTINENT MEDICATIONS:  Outpatient Encounter Medications as of 07/07/2022  Medication Sig   amoxicillin-clavulanate (AUGMENTIN) 875-125 MG tablet Take 1 tablet by mouth 2 (two) times daily.   aspirin EC 81 MG tablet Take 81 mg by mouth daily.   B Complex-C (B-COMPLEX WITH VITAMIN C) tablet Take 1 tablet by mouth daily.   blood glucose meter kit and supplies Dispense based on patient and insurance preference. Use up to four times daily as directed. (FOR ICD-10 E10.9, E11.9).   Cholecalciferol (VITAMIN D PO) Take by mouth.   CINNAMON PO Take 5 mLs by mouth as directed. Daily with coffee.   Coenzyme Q10 (COQ10 PO) Take 1 capsule by mouth daily.   CRANBERRY PO Take by mouth.   Glucosamine-Chondroitin (GLUCOSAMINE CHONDR COMPLEX PO) Take 1 tablet by mouth daily.   magnesium 30 MG tablet Take 30 mg by mouth 2 (two) times daily.   Multiple Vitamin (MULTIVITAMIN WITH MINERALS) TABS tablet Take 1 tablet by mouth daily.   naproxen sodium (ALEVE) 220 MG tablet Take 220 mg by mouth daily as needed (pain).   nystatin cream (MYCOSTATIN) APPLY EXTERNALLY TO THE AFFECTED AREA TWICE DAILY (Patient not taking: Reported on 01/27/2020)   Omega-3 Fatty Acids (FISH OIL) 1000 MG CAPS Take by mouth.   TURMERIC PO Take 1 capsule by mouth daily.   vitamin C (ASCORBIC ACID) 500 MG tablet Take 500 mg by mouth daily.   VITAMIN E PO Take 1 tablet by mouth daily.   No facility-administered encounter medications on file as of 07/07/2022.    Thank you for the opportunity to participate in the care of Ms. Aldaz.  The palliative care team will continue to follow. Please call our office at [phone] if we can be of additional assistance.   Hollace Kinnier, DO  COVID-19  PATIENT  SCREENING TOOL Asked and negative response unless otherwise noted:  Have you had symptoms of covid, tested positive or been in contact with someone with symptoms/positive test in the past 5-10 days? No

## 2022-09-06 ENCOUNTER — Other Ambulatory Visit: Payer: Medicare Other | Admitting: Internal Medicine

## 2022-09-06 ENCOUNTER — Encounter: Payer: Self-pay | Admitting: Internal Medicine

## 2022-09-06 VITALS — HR 75 | Temp 98.6°F | Resp 16

## 2022-09-06 DIAGNOSIS — M6281 Muscle weakness (generalized): Secondary | ICD-10-CM

## 2022-09-06 DIAGNOSIS — I872 Venous insufficiency (chronic) (peripheral): Secondary | ICD-10-CM

## 2022-09-06 DIAGNOSIS — Z515 Encounter for palliative care: Secondary | ICD-10-CM

## 2022-09-06 DIAGNOSIS — F03C0 Unspecified dementia, severe, without behavioral disturbance, psychotic disturbance, mood disturbance, and anxiety: Secondary | ICD-10-CM

## 2022-09-06 NOTE — Progress Notes (Addendum)
[facility] Follow-Up Visit Telephone: [phone]  Fax: [phone]   Date of encounter: 09/06/22 1:38 PM PATIENT NAME: Suzanne Case [place] [id number]   [id number] (home)  DOB: [birth year] MRN: [id number] PRIMARY CARE PROVIDER:    Clovia Cuff, MD,  [address] [place] [address] [phone]  REFERRING PROVIDER:   Clovia Cuff, MD [address] [place],  [place] [address] [phone]  RESPONSIBLE PARTY:    Contact Information     Name Relation Home Work Mobile   Bezdek,Janell Daughter [phone]  [phone]   Verble, [place] [phone]          I met face to face with patient and family in her home with her daughter, Suzanne Case.   Palliative Care was asked to follow this patient by consultation request of  Clovia Cuff, MD to address advance care planning and complex medical decision making. This is follow-up visit.                                     ASSESSMENT AND PLAN / RECOMMENDATIONS:   Advance Care Planning/Goals of Care: Goals include to maximize quality of life and symptom management. Patient/health care surrogate gave his/her permission to discuss.Our advance care planning conversation included a discussion about:    The value and importance of advance care planning  Experiences with loved ones who have been seriously ill or have died  Exploration of personal, cultural or spiritual beliefs that might influence medical decisions  Exploration of goals of care in the event of a sudden injury or illness  Identification  of a healthcare agent--Cerros,Janell (Daughter)  [phone] (Mobile) Review and updating or creation of an  advance directive document . Decision not to resuscitate or to de-escalate disease focused treatments due to poor prognosis. CODE STATUS:  DNR, MOST on file.    Symptom Management/Plan: 1. Severe dementia without behavioral  disturbance, psychotic disturbance, mood disturbance, or anxiety, unspecified dementia type ([place]) -is now borderline hospice eligible -still speaks clearly once awake and able to ambulate with help of daughter, likes to get up oob  -awaits lift chair ordered by Dr. Altamese Dilling office -FAST 6e  2. Venous insufficiency of both lower extremities -continue elevation of feet, maintaining moisture of skin  3. Palliative care by specialist -discussed hospice eligibility criteria again -she is eating less -will ask RN/SW to f/u in 6-8 weeks to reassess  Proximal muscle weakness -pt's daughter notes that she is unable to stand up out of a chair now without support -also struggling to ambulate afterward due to forgetting how at times -requesting lift chair for her  Follow up Palliative Care Visit: Palliative care will continue to follow for complex medical decision making, advance care planning, and clarification of goals. Return 6-8 weeks and prn.  This visit was coded based on medical decision making (MDM).  PPS: 40%  HOSPICE ELIGIBILITY/DIAGNOSIS: borderline for dementia  Chief Complaint: Follow-up palliative visit  HISTORY OF PRESENT ILLNESS:  Staphanie Case is a 88 y.o. year old female  with severe dementia, htn, hyperlipidemia, venous insufficiency, OA of her knees and some chronic lower back and hip pain seen in palliative f/u.  Her daughter, Suzanne Case, gave me a call yesterday concerned b/c her mother has been sleeping most of the day and wanting to be sure it would be ok if she just woke her in time  for the visit.  She also mentions that her mom has declined considerably since I last saw her at the end of august.    She is not sleeping at night and sleeps majority of the day.  She no longer knows how to ambulate even with her walker--cannot move her feet.  She is struggling with transfers.  She's had a few falls--three since last visit.  She is eating less than she had.  Suzanne Case is  trying to schedule her meals during her wakeful hours, but her intake is less nonetheless.  She is eating 50% of what she did our last visit.  Does best at breakfast.  Ate 1/2 pork chop with sweet potato.  Likes her veggies best.  Uses salonpas, sometimes diclofenac gel and uses aleve for her knee OA.    Has bms and incontinence at times.  Having incontinence of urine.  Using wheelchair to get around more.  Still has rollator also but Janell has to be behind her.  One evening she could not get out of a chair and her son had to get her up.    They are on the wait list for assistance in the home now it sounds like was ordered through Dr. Altamese Dilling office.  Pt does not have medicare.  They've also asked for a lift chair.  Pt likes to be left alone in the bed.    History obtained from review of EMR, discussion with primary team, and interview with family, facility staff/caregiver and/or Ms. Reigle.   I reviewed available labs, medications, imaging, studies and related documents from the EMR.  Records reviewed and summarized above.   ROS Review of Systems  Constitutional:  Positive for activity change, appetite change and fatigue. Negative for chills and fever.  HENT:  Negative for congestion and trouble swallowing.   Eyes:  Negative for visual disturbance.  Respiratory:  Negative for chest tightness and shortness of breath.   Gastrointestinal:  Negative for constipation, nausea and vomiting.  Genitourinary:  Negative for dysuria.       Incontinence  Musculoskeletal:  Positive for arthralgias and gait problem.  Skin:  Negative for color change.  Neurological:  Positive for weakness. Negative for dizziness.  Psychiatric/Behavioral:  Positive for confusion. Negative for agitation.        Sleep cycles altered    Physical Exam: Vitals:   09/06/22 1330  Pulse: 75  Resp: 16  Temp: 98.6 F (37 C)  SpO2: 95%   There is no height or weight on file to calculate BMI. Wt Readings from [employer]  Encounters:  01/27/20 182 lb (82.6 kg)  01/08/19 182 lb (82.6 kg)  10/06/18 173 lb (78.5 kg)  06/07/18 173 lb 3.2 oz (78.6 kg)  07/02/16 172 lb (78 kg)  05/24/16 185 lb 3 oz (84 kg)  04/10/15 194 lb (88 kg)  04/07/15 194 lb (88 kg)  04/05/15 198 lb (89.8 kg)   Physical Exam Constitutional:      Comments: Once awakened but dysarthric  Cardiovascular:     Rate and Rhythm: Normal rate and regular rhythm.     Heart sounds: Murmur heard.  Pulmonary:     Effort: Pulmonary effort is normal.     Breath sounds: Normal breath sounds. No wheezing, rhonchi or rales.  Abdominal:     General: Bowel sounds are normal.     Palpations: Abdomen is soft.     Tenderness: There is no abdominal tenderness.  Musculoskeletal:  General: Normal range of motion.     Right lower leg: No edema.     Left lower leg: No edema.  Skin:    General: Skin is warm and dry.     Comments: Scaly dark pigmented skin on anterior shins  Neurological:     General: No focal deficit present.     Mental Status: She is alert and oriented to person, place, and time.     Motor: Weakness present.     Gait: Gait abnormal.  Psychiatric:        Mood and Affect: Mood normal.     Comments: Wants to be left alone in her bed     CURRENT PROBLEM LIST:  Patient Active Problem List   Diagnosis Date Noted   Dementia ([place]) 05/18/2018   Arthritis 05/18/2018   Muscle weakness 05/18/2018   UTI (lower urinary tract infection) 05/24/2016   Fever 05/24/2016   Tachycardia 05/24/2016    PAST MEDICAL HISTORY:  Active Ambulatory Problems    Diagnosis Date Noted   UTI (lower urinary tract infection) 05/24/2016   Fever 05/24/2016   Tachycardia 05/24/2016   Dementia ([place]) 05/18/2018   Arthritis 05/18/2018   Muscle weakness 05/18/2018   Resolved Ambulatory Problems    Diagnosis Date Noted   No Resolved Ambulatory Problems   No Additional Past Medical History    SOCIAL HX:  Social History   Tobacco Use   Smoking  status: Never   Smokeless tobacco: Never  Substance Use Topics   Alcohol use: No    Alcohol/week: 0.0 standard drinks of alcohol     ALLERGIES: No Known Allergies    PERTINENT MEDICATIONS:  Outpatient Encounter Medications as of 09/06/2022  Medication Sig   amoxicillin-clavulanate (AUGMENTIN) 875-125 MG tablet Take 1 tablet by mouth 2 (two) times daily.   aspirin EC 81 MG tablet Take 81 mg by mouth daily.   B Complex-C (B-COMPLEX WITH VITAMIN C) tablet Take 1 tablet by mouth daily.   blood glucose meter kit and supplies Dispense based on patient and insurance preference. Use up to four times daily as directed. (FOR ICD-10 E10.9, E11.9).   Cholecalciferol (VITAMIN D PO) Take by mouth.   CINNAMON PO Take 5 mLs by mouth as directed. Daily with coffee.   Coenzyme Q10 (COQ10 PO) Take 1 capsule by mouth daily.   CRANBERRY PO Take by mouth.   Glucosamine-Chondroitin (GLUCOSAMINE CHONDR COMPLEX PO) Take 1 tablet by mouth daily.   magnesium 30 MG tablet Take 30 mg by mouth 2 (two) times daily.   Multiple Vitamin (MULTIVITAMIN WITH MINERALS) TABS tablet Take 1 tablet by mouth daily.   naproxen sodium (ALEVE) 220 MG tablet Take 220 mg by mouth daily as needed (pain).   nystatin cream (MYCOSTATIN) APPLY EXTERNALLY TO THE AFFECTED AREA TWICE DAILY (Patient not taking: Reported on 01/27/2020)   Omega-3 Fatty Acids (FISH OIL) 1000 MG CAPS Take by mouth.   TURMERIC PO Take 1 capsule by mouth daily.   vitamin C (ASCORBIC ACID) 500 MG tablet Take 500 mg by mouth daily.   VITAMIN E PO Take 1 tablet by mouth daily.   No facility-administered encounter medications on file as of 09/06/2022.    Thank you for the opportunity to participate in the care of Ms. Graig.  The palliative care team will continue to follow. Please call our office at [phone] if we can be of additional assistance.   Hollace Kinnier, DO  COVID-19 PATIENT SCREENING TOOL Asked and negative response unless  otherwise  noted:  Have you had symptoms of covid, tested positive or been in contact with someone with symptoms/positive test in the past 5-10 days? no
